# Patient Record
Sex: Female | Born: 1985 | Race: White | Hispanic: No | Marital: Married | Smoking: Never smoker
Health system: Southern US, Community
[De-identification: ages and names within clinical notes are randomized; demographics above are authoritative.]

## PROBLEM LIST (undated history)

## (undated) ENCOUNTER — Inpatient Hospital Stay (HOSPITAL_COMMUNITY): Payer: 59

## (undated) DIAGNOSIS — O4290 Premature rupture of membranes, unspecified as to length of time between rupture and onset of labor, unspecified weeks of gestation: Secondary | ICD-10-CM

## (undated) DIAGNOSIS — N83209 Unspecified ovarian cyst, unspecified side: Secondary | ICD-10-CM

## (undated) DIAGNOSIS — N200 Calculus of kidney: Secondary | ICD-10-CM

## (undated) DIAGNOSIS — I10 Essential (primary) hypertension: Secondary | ICD-10-CM

## (undated) DIAGNOSIS — O139 Gestational [pregnancy-induced] hypertension without significant proteinuria, unspecified trimester: Secondary | ICD-10-CM

## (undated) HISTORY — DX: Essential (primary) hypertension: I10

## (undated) HISTORY — DX: Calculus of kidney: N20.0

## (undated) HISTORY — PX: WISDOM TOOTH EXTRACTION: SHX21

---
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
## 2003-03-19 ENCOUNTER — Other Ambulatory Visit: Admission: RE | Admit: 2003-03-19 | Discharge: 2003-03-19 | Payer: Self-pay | Admitting: Gynecology

## 2003-10-06 ENCOUNTER — Other Ambulatory Visit: Admission: RE | Admit: 2003-10-06 | Discharge: 2003-10-06 | Payer: Self-pay | Admitting: Gynecology

## 2004-01-27 ENCOUNTER — Other Ambulatory Visit: Admission: RE | Admit: 2004-01-27 | Discharge: 2004-01-27 | Payer: Self-pay | Admitting: Gynecology

## 2004-08-31 ENCOUNTER — Other Ambulatory Visit: Admission: RE | Admit: 2004-08-31 | Discharge: 2004-08-31 | Payer: Self-pay | Admitting: Gynecology

## 2005-04-06 ENCOUNTER — Other Ambulatory Visit: Admission: RE | Admit: 2005-04-06 | Discharge: 2005-04-06 | Payer: Self-pay | Admitting: Gynecology

## 2006-03-30 ENCOUNTER — Other Ambulatory Visit: Admission: RE | Admit: 2006-03-30 | Discharge: 2006-03-30 | Payer: Self-pay | Admitting: Gynecology

## 2006-12-06 ENCOUNTER — Encounter: Payer: Self-pay | Admitting: Internal Medicine

## 2006-12-29 ENCOUNTER — Ambulatory Visit: Payer: Self-pay | Admitting: Internal Medicine

## 2006-12-29 DIAGNOSIS — I10 Essential (primary) hypertension: Secondary | ICD-10-CM | POA: Insufficient documentation

## 2007-01-04 LAB — CONVERTED CEMR LAB
CO2: 27 meq/L (ref 19–32)
Calcium: 9.6 mg/dL (ref 8.4–10.5)
Chloride: 103 meq/L (ref 96–112)
Glucose, Bld: 81 mg/dL (ref 70–99)
Sodium: 139 meq/L (ref 135–145)

## 2007-01-05 ENCOUNTER — Encounter: Payer: Self-pay | Admitting: Internal Medicine

## 2007-01-05 ENCOUNTER — Ambulatory Visit: Payer: Self-pay

## 2007-01-31 ENCOUNTER — Ambulatory Visit: Payer: Self-pay | Admitting: Internal Medicine

## 2007-02-27 ENCOUNTER — Telehealth: Payer: Self-pay | Admitting: Internal Medicine

## 2007-04-03 ENCOUNTER — Ambulatory Visit: Payer: Self-pay | Admitting: Internal Medicine

## 2007-04-10 LAB — CONVERTED CEMR LAB
Basophils Absolute: 0 10*3/uL (ref 0.0–0.1)
Creatinine, Ser: 0.7 mg/dL (ref 0.4–1.2)
Eosinophils Absolute: 0.4 10*3/uL (ref 0.0–0.6)
GFR calc Af Amer: 133 mL/min
GFR calc non Af Amer: 110 mL/min
HCT: 39.9 % (ref 36.0–46.0)
Hemoglobin: 13.6 g/dL (ref 12.0–15.0)
MCHC: 34 g/dL (ref 30.0–36.0)
MCV: 90.9 fL (ref 78.0–100.0)
Monocytes Absolute: 0.3 10*3/uL (ref 0.2–0.7)
Neutro Abs: 3 10*3/uL (ref 1.4–7.7)
Neutrophils Relative %: 55.5 % (ref 43.0–77.0)
Potassium: 4.2 meq/L (ref 3.5–5.1)
Sodium: 139 meq/L (ref 135–145)

## 2007-04-18 ENCOUNTER — Encounter: Payer: Self-pay | Admitting: Internal Medicine

## 2007-12-17 ENCOUNTER — Encounter: Payer: Self-pay | Admitting: Internal Medicine

## 2007-12-27 ENCOUNTER — Ambulatory Visit: Payer: Self-pay | Admitting: Internal Medicine

## 2007-12-28 ENCOUNTER — Ambulatory Visit: Payer: Self-pay | Admitting: Internal Medicine

## 2007-12-29 ENCOUNTER — Encounter: Payer: Self-pay | Admitting: Internal Medicine

## 2007-12-29 LAB — CONVERTED CEMR LAB: Rubella: 27.9 intl units/mL — ABNORMAL HIGH

## 2008-01-01 ENCOUNTER — Telehealth (INDEPENDENT_AMBULATORY_CARE_PROVIDER_SITE_OTHER): Payer: Self-pay | Admitting: *Deleted

## 2008-01-01 LAB — CONVERTED CEMR LAB
BUN: 12 mg/dL (ref 6–23)
Creatinine, Ser: 0.7 mg/dL (ref 0.4–1.2)
Eosinophils Absolute: 0.2 10*3/uL (ref 0.0–0.7)
Eosinophils Relative: 4.2 % (ref 0.0–5.0)
GFR calc Af Amer: 132 mL/min
GFR calc non Af Amer: 109 mL/min
HCT: 39.9 % (ref 36.0–46.0)
MCV: 91.7 fL (ref 78.0–100.0)
Monocytes Absolute: 0.3 10*3/uL (ref 0.1–1.0)
Platelets: 277 10*3/uL (ref 150–400)
Potassium: 3.7 meq/L (ref 3.5–5.1)
WBC: 4.7 10*3/uL (ref 4.5–10.5)

## 2008-01-04 ENCOUNTER — Encounter (INDEPENDENT_AMBULATORY_CARE_PROVIDER_SITE_OTHER): Payer: Self-pay | Admitting: *Deleted

## 2008-01-18 ENCOUNTER — Ambulatory Visit: Payer: Self-pay

## 2008-01-18 ENCOUNTER — Encounter: Payer: Self-pay | Admitting: Internal Medicine

## 2008-01-22 ENCOUNTER — Telehealth (INDEPENDENT_AMBULATORY_CARE_PROVIDER_SITE_OTHER): Payer: Self-pay | Admitting: *Deleted

## 2008-04-01 ENCOUNTER — Ambulatory Visit: Payer: Self-pay | Admitting: Internal Medicine

## 2008-04-09 ENCOUNTER — Encounter: Admission: RE | Admit: 2008-04-09 | Discharge: 2008-04-09 | Payer: Self-pay | Admitting: Internal Medicine

## 2008-04-10 ENCOUNTER — Telehealth (INDEPENDENT_AMBULATORY_CARE_PROVIDER_SITE_OTHER): Payer: Self-pay | Admitting: *Deleted

## 2008-06-03 ENCOUNTER — Encounter: Payer: Self-pay | Admitting: Internal Medicine

## 2008-06-25 ENCOUNTER — Ambulatory Visit: Payer: Self-pay | Admitting: Internal Medicine

## 2009-04-21 ENCOUNTER — Ambulatory Visit: Payer: Self-pay | Admitting: Internal Medicine

## 2009-04-23 LAB — CONVERTED CEMR LAB
BUN: 15 mg/dL (ref 6–23)
CO2: 30 meq/L (ref 19–32)
Chloride: 107 meq/L (ref 96–112)
Glucose, Bld: 93 mg/dL (ref 70–99)
LDL Cholesterol: 73 mg/dL (ref 0–99)
Potassium: 4.1 meq/L (ref 3.5–5.1)
Total CHOL/HDL Ratio: 2
VLDL: 5.4 mg/dL (ref 0.0–40.0)

## 2009-05-11 ENCOUNTER — Encounter: Payer: Self-pay | Admitting: Internal Medicine

## 2009-09-23 ENCOUNTER — Telehealth: Payer: Self-pay | Admitting: Internal Medicine

## 2010-03-14 LAB — CONVERTED CEMR LAB: Cholesterol: 252 mg/dL

## 2010-03-16 NOTE — Letter (Signed)
Summary: Beather Arbour MD  Beather Arbour MD   Imported By: Lanelle Bal 05/16/2009 10:12:02  _____________________________________________________________________  External Attachment:    Type:   Image     Comment:   External Document

## 2010-03-16 NOTE — Assessment & Plan Note (Signed)
Summary: FOR MED REFILL//PH   Vital Signs:  Patient profile:   25 year old female Height:      63 inches Weight:      122.2 pounds BMI:     21.73 Pulse rate:   72 / minute BP sitting:   120 / 80  Vitals Entered By: Shary Decamp (April 21, 2009 11:41 AM)  History of Present Illness: HTN f/u   Current Medications (verified): 1)  Lisinopril 20 Mg  Tabs (Lisinopril) .Marland Kitchen.. 1 By Mouth Qd 2)  Mirena 20 Mcg/24hr Iud (Levonorgestrel)  Allergies (verified): No Known Drug Allergies  Past History:  Past Medical History: Reviewed history from 06/25/2008 and no changes required.  G0 Hypertension  Past Surgical History: Reviewed history from 06/25/2008 and no changes required. 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 CV:  Denies swelling of feet. Resp:  Denies cough and shortness of breath. Psych:  ++ stress due to training for anesthesia .  Physical Exam  General:  alert, well-developed, and well-nourished.   Lungs:  normal respiratory effort, no intercostal retractions, no accessory muscle use, and normal breath sounds.   Heart:  normal rate, regular rhythm, and no murmur.   Extremities:  no pretibial edema bilaterally    Impression & Recommendations:  Problem # 1:  HYPERTENSION (ICD-401.9) well-controlled particularly since she discontinued birth control pills, see review of systems review medications labs return to the office in one year she is advised to call me if her blood pressure is going down to 110 or 100 and she is symptomatic; she will have to decrease the lisinopril  dose if that is the case Her updated medication list for this problem includes:    Lisinopril 20 Mg Tabs (Lisinopril) .Marland Kitchen... 1 by mouth qd  Orders: Venipuncture (11914) TLB-BMP (Basic Metabolic  Panel-BMET) (80048-METABOL) TLB-Lipid Panel (80061-LIPID)  Complete Medication List: 1)  Lisinopril 20 Mg Tabs (Lisinopril) .Marland Kitchen.. 1 by mouth qd 2)  Iud (cooper)   Patient Instructions: 1)  Please schedule a follow-up appointment in 1 year.  Prescriptions: LISINOPRIL 20 MG  TABS (LISINOPRIL) 1 by mouth qd  #30 x 6   Entered by:   Shary Decamp   Authorized by:   Nolon Rod. Meko Bellanger MD   Signed by:   Shary Decamp on 04/21/2009   Method used:   Printed then faxed to ...       [facility] (retail)       [address]       [place], [place]  [address]       Ph: [phone]       Fax: [phone]   RxID:   [id number]

## 2010-03-16 NOTE — Progress Notes (Signed)
Summary: Lice?   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 She thinks she may have lice. She asked if we could give her a call back. Called pt back and left a message for pt to call back. Army Fossa CMA  September 23, 2009 3:54 PM   Follow-up for Phone Call        Pt states that she has done the home treatments twice. One of the physicans she works with called in Malth[place](?) for her. That is not working. She would like to know if Dr.Paz has any other suggestions. She is in [place] doing her clinical rotations. Army Fossa CMA  September 23, 2009 3:58 PM   Additional Follow-up for Phone Call Additional follow up Details #1::         Elimite liqid to her hair and scalp , washed it off in 10 minutes. Repeat in one week. needs to wash and iron  all her clothes and bed sheets Additional Follow-up by: Jose E. Paz MD,  September 24, 2009 1:16 PM    Additional Follow-up for Phone Call Additional follow up Details #2::    I spoke with pt she is aware of these instructions she will try this. Army Fossa CMA  September 24, 2009 1:21 PM      Appended Document: Lice?     Clinical Lists Changes  Medications: Added new medication of RID COMPLETE LICE ELIMINATION  KIT (PYRETH-PIP BUTOX-PERMETH-NITRE) her hair and scalp , washed it off in 10 minutes. Repeat in one week. - Signed Rx of RID COMPLETE LICE ELIMINATION  KIT (PYRETH-PIP BUTOX-PERMETH-NITRE) her hair and scalp , washed it off in 10 minutes. Repeat in one week.;  #1 bottle x 0;  Signed;  Entered by: Army Fossa CMA;  Authorized by: Nolon Rod Paz MD;  Method used: Electronically to [facility] [address], [place], [place], [place]  [address], Ph: [phone], Fax: [phone]    Prescriptions: RID COMPLETE LICE ELIMINATION  KIT (PYRETH-PIP BUTOX-PERMETH-NITRE) her hair and scalp ,  washed it off in 10 minutes. Repeat in one week.  #1 bottle x 0   Entered by:   Army Fossa CMA   Authorized by:   Nolon Rod. Paz MD   Signed by:   Army Fossa CMA on 09/25/2009   Method used:   Electronically to        [facility] (retail)       [address]       [place], [place]  [address]       Ph: [phone]       Fax: [phone]   RxID:   [id number]   Pt requested it be called into [facility] [address]

## 2010-05-24 ENCOUNTER — Other Ambulatory Visit: Payer: Self-pay | Admitting: Internal Medicine

## 2010-06-24 ENCOUNTER — Other Ambulatory Visit: Payer: Self-pay | Admitting: Internal Medicine

## 2010-07-18 ENCOUNTER — Other Ambulatory Visit: Payer: Self-pay | Admitting: Internal Medicine

## 2010-07-19 ENCOUNTER — Telehealth: Payer: Self-pay | Admitting: *Deleted

## 2010-07-19 NOTE — Telephone Encounter (Signed)
Pt is due for an appt, please tell pt no additional refills will be given without an appt.

## 2010-07-28 NOTE — Telephone Encounter (Signed)
LMOM to call to schedule appt with Dr Remus Blake that it was very important for her to call and schedule this appt as soon as she could

## 2010-08-11 NOTE — Telephone Encounter (Signed)
Has appt with Dr Drue Novel on 7/12 at 9:15

## 2010-08-26 ENCOUNTER — Encounter: Payer: Self-pay | Admitting: Internal Medicine

## 2010-08-26 ENCOUNTER — Ambulatory Visit (INDEPENDENT_AMBULATORY_CARE_PROVIDER_SITE_OTHER): Payer: PRIVATE HEALTH INSURANCE | Admitting: Internal Medicine

## 2010-08-26 VITALS — BP 124/86 | HR 74 | Wt 129.0 lb

## 2010-08-26 DIAGNOSIS — I10 Essential (primary) hypertension: Secondary | ICD-10-CM

## 2010-08-26 LAB — CBC WITH DIFFERENTIAL/PLATELET
Basophils Relative: 0.3 % (ref 0.0–3.0)
Eosinophils Absolute: 0.4 10*3/uL (ref 0.0–0.7)
HCT: 42 % (ref 36.0–46.0)
Hemoglobin: 14.8 g/dL (ref 12.0–15.0)
Lymphocytes Relative: 28.8 % (ref 12.0–46.0)
Lymphs Abs: 2 10*3/uL (ref 0.7–4.0)
MCHC: 35.1 g/dL (ref 30.0–36.0)
MCV: 93.5 fl (ref 78.0–100.0)
Monocytes Absolute: 0.5 10*3/uL (ref 0.1–1.0)
Neutro Abs: 4 10*3/uL (ref 1.4–7.7)
RBC: 4.49 Mil/uL (ref 3.87–5.11)

## 2010-08-26 LAB — LIPID PANEL: Cholesterol: 180 mg/dL (ref 0–200)

## 2010-08-26 LAB — AST: AST: 20 U/L (ref 0–37)

## 2010-08-26 MED ORDER — LABETALOL HCL 100 MG PO TABS: 100.0000 mg | ORAL_TABLET | Freq: Two times a day (BID) | ORAL | Status: DC

## 2010-08-26 NOTE — Assessment & Plan Note (Addendum)
BP well-controlled with lisinopril 20 mg however patient is planning to get pregnant. Switch to labetalol. See instructions. Also likes general labs. See orders

## 2010-08-26 NOTE — Progress Notes (Signed)
  Subjective:    Patient ID: Tanya Hicks, female    DOB: [birth year], 25 y.o.   MRN: [id number]  HPI Doing well Likes to switch medications since she is planning to start a family  in 6-12 months.  Past Medical History  Diagnosis Date  . Hypertension    No past surgical history on file.   Review of Systems No chest pain, shortness of breath. No lower extremity edema.    Objective:   Physical Exam  Constitutional: She appears well-developed.  HENT:  Head: Normocephalic and atraumatic.  Cardiovascular: Normal rate, regular rhythm and normal heart sounds.   No murmur heard. Pulmonary/Chest: Effort normal and breath sounds normal. No respiratory distress. She has no wheezes. She has no rales.  Musculoskeletal: She exhibits no edema.          Assessment & Plan:

## 2010-08-26 NOTE — Patient Instructions (Signed)
Start labetalol BP goal 110/60 to 140/85, pulse no less than 55 Call with BP readings in 2 week Ok to self titrate to 2 tabs bid if needed

## 2010-08-30 ENCOUNTER — Telehealth: Payer: Self-pay | Admitting: *Deleted

## 2010-08-30 NOTE — Telephone Encounter (Signed)
Message left for patient to return my call.  

## 2010-08-30 NOTE — Telephone Encounter (Signed)
Message copied by Leanne Lovely on Mon Aug 30, 2010  2:17 PM ------      Message from: Tanya Hicks      Created: Mon Aug 30, 2010  1:19 PM       Advise patient, her cholesterol is very good, liver tests and CBC normal as well. Good results!

## 2010-08-30 NOTE — Telephone Encounter (Signed)
Spoke w/ pt she is aware. Copy mailed to pt.

## 2010-09-10 ENCOUNTER — Telehealth: Payer: Self-pay | Admitting: Internal Medicine

## 2010-09-10 NOTE — Telephone Encounter (Signed)
Pt says she was told to call w/ bp ranges have been between 105/72-122/81 and pulse have been at 71-82.

## 2010-09-10 NOTE — Telephone Encounter (Signed)
I spoke w/ pt she states she is feeling fine.

## 2010-09-10 NOTE — Telephone Encounter (Signed)
Blood pressure is slightly low at times (105), as long as she feels well I wouldn't change anything

## 2011-02-24 LAB — OB RESULTS CONSOLE GBS: GBS: NEGATIVE

## 2011-05-17 ENCOUNTER — Other Ambulatory Visit: Payer: Self-pay | Admitting: Internal Medicine

## 2011-05-18 NOTE — Telephone Encounter (Signed)
Refill done.  

## 2011-06-22 ENCOUNTER — Other Ambulatory Visit: Payer: Self-pay | Admitting: Internal Medicine

## 2011-06-23 NOTE — Telephone Encounter (Signed)
Refill done.  

## 2011-07-30 ENCOUNTER — Other Ambulatory Visit: Payer: Self-pay | Admitting: Internal Medicine

## 2011-08-01 NOTE — Telephone Encounter (Signed)
Refill done. Pt must have an office visit for further refills.  

## 2011-09-05 LAB — OB RESULTS CONSOLE ABO/RH

## 2011-09-05 LAB — OB RESULTS CONSOLE GC/CHLAMYDIA: Gonorrhea: NEGATIVE

## 2011-09-05 LAB — OB RESULTS CONSOLE RPR: RPR: NONREACTIVE

## 2011-09-05 LAB — OB RESULTS CONSOLE ANTIBODY SCREEN: Antibody Screen: NEGATIVE

## 2012-03-15 ENCOUNTER — Other Ambulatory Visit: Payer: Self-pay | Admitting: Obstetrics and Gynecology

## 2012-03-16 ENCOUNTER — Encounter (HOSPITAL_COMMUNITY): Payer: Self-pay | Admitting: *Deleted

## 2012-03-16 ENCOUNTER — Inpatient Hospital Stay (HOSPITAL_COMMUNITY)
Admission: AD | Admit: 2012-03-16 | Discharge: 2012-03-19 | DRG: 774 | Disposition: A | Discharge status: Home / Self Care | Payer: 59 | Source: Ambulatory Visit | Attending: Obstetrics and Gynecology | Admitting: Obstetrics and Gynecology

## 2012-03-16 ENCOUNTER — Encounter (HOSPITAL_COMMUNITY): Payer: Self-pay | Admitting: Anesthesiology

## 2012-03-16 ENCOUNTER — Inpatient Hospital Stay (HOSPITAL_COMMUNITY): Payer: 59 | Admitting: Anesthesiology

## 2012-03-16 DIAGNOSIS — I10 Essential (primary) hypertension: Secondary | ICD-10-CM

## 2012-03-16 DIAGNOSIS — O1002 Pre-existing essential hypertension complicating childbirth: Secondary | ICD-10-CM | POA: Diagnosis present

## 2012-03-16 HISTORY — DX: Unspecified ovarian cyst, unspecified side: N83.209

## 2012-03-16 LAB — COMPREHENSIVE METABOLIC PANEL
Alkaline Phosphatase: 136 U/L — ABNORMAL HIGH (ref 39–117)
BUN: 7 mg/dL (ref 6–23)
CO2: 20 mEq/L (ref 19–32)
GFR calc Af Amer: >90 mL/min (ref >90)
GFR calc non Af Amer: >90 mL/min (ref >90)
Glucose, Bld: 117 mg/dL — ABNORMAL HIGH (ref 70–99)
Potassium: 3.3 mEq/L — ABNORMAL LOW (ref 3.5–5.1)
Total Bilirubin: 0.3 mg/dL (ref 0.3–1.2)
Total Protein: 6.1 g/dL (ref 6.0–8.3)

## 2012-03-16 LAB — CBC
HCT: 34.1 % — ABNORMAL LOW (ref 36.0–46.0)
Hemoglobin: 11.8 g/dL — ABNORMAL LOW (ref 12.0–15.0)
MCH: 31.7 pg (ref 26.0–34.0)
MCHC: 34.6 g/dL (ref 30.0–36.0)
MCHC: 34.3 g/dL (ref 30.0–36.0)
RBC: 3.69 MIL/uL — ABNORMAL LOW (ref 3.87–5.11)
RDW: 13.5 % (ref 11.5–15.5)

## 2012-03-16 LAB — URINALYSIS, DIPSTICK ONLY
Bilirubin Urine: NEGATIVE
Ketones, ur: NEGATIVE mg/dL
Nitrite: NEGATIVE
Protein, ur: NEGATIVE mg/dL
Urobilinogen, UA: 0.2 mg/dL (ref 0.0–1.0)

## 2012-03-16 LAB — RPR: RPR Ser Ql: NONREACTIVE

## 2012-03-16 MED ORDER — OXYTOCIN 40 UNITS IN LACTATED RINGERS INFUSION - SIMPLE MED
1.0000 m[IU]/min | INTRAVENOUS | Status: DC
  Administered 2012-03-16: 23 m[IU]/min via INTRAVENOUS
  Administered 2012-03-16: 1 m[IU]/min via INTRAVENOUS
  Filled 2012-03-16: qty 1000

## 2012-03-16 MED ORDER — LIDOCAINE HCL (PF) 1 % IJ SOLN
30.0000 mL | INTRAMUSCULAR | Status: DC | PRN
  Administered 2012-03-17: 30 mL via SUBCUTANEOUS
  Filled 2012-03-16: qty 30

## 2012-03-16 MED ORDER — LABETALOL HCL 100 MG PO TABS
100.0000 mg | ORAL_TABLET | Freq: Two times a day (BID) | ORAL | Status: DC
  Administered 2012-03-16 – 2012-03-17 (×2): 100 mg via ORAL
  Filled 2012-03-16 (×4): qty 1

## 2012-03-16 MED ORDER — OXYTOCIN 40 UNITS IN LACTATED RINGERS INFUSION - SIMPLE MED
62.5000 mL/h | INTRAVENOUS | Status: DC
  Administered 2012-03-17: 62.5 mL/h via INTRAVENOUS
  Filled 2012-03-16: qty 1000

## 2012-03-16 MED ORDER — DIPHENHYDRAMINE HCL 50 MG/ML IJ SOLN: 12.5000 mg | INTRAMUSCULAR | Status: DC | PRN

## 2012-03-16 MED ORDER — LIDOCAINE HCL (PF) 1 % IJ SOLN
INTRAMUSCULAR | Status: DC | PRN
  Administered 2012-03-16 (×4): 4 mL

## 2012-03-16 MED ORDER — LACTATED RINGERS IV SOLN: 500.0000 mL | INTRAVENOUS | Status: DC | PRN

## 2012-03-16 MED ORDER — LACTATED RINGERS IV SOLN
500.0000 mL | Freq: Once | INTRAVENOUS | Status: AC
  Administered 2012-03-16: 250 mL via INTRAVENOUS

## 2012-03-16 MED ORDER — OXYCODONE-ACETAMINOPHEN 5-325 MG PO TABS: 1.0000 | ORAL_TABLET | ORAL | Status: DC | PRN

## 2012-03-16 MED ORDER — LACTATED RINGERS IV SOLN
INTRAVENOUS | Status: DC
  Administered 2012-03-16 – 2012-03-17 (×3): via INTRAVENOUS

## 2012-03-16 MED ORDER — PHENYLEPHRINE 40 MCG/ML (10ML) SYRINGE FOR IV PUSH (FOR BLOOD PRESSURE SUPPORT): 80.0000 ug | PREFILLED_SYRINGE | INTRAVENOUS | Status: DC | PRN

## 2012-03-16 MED ORDER — ONDANSETRON HCL 4 MG/2ML IJ SOLN
4.0000 mg | Freq: Four times a day (QID) | INTRAMUSCULAR | Status: DC | PRN
  Administered 2012-03-16 – 2012-03-17 (×2): 4 mg via INTRAVENOUS
  Filled 2012-03-16 (×2): qty 2

## 2012-03-16 MED ORDER — IBUPROFEN 600 MG PO TABS: 600.0000 mg | ORAL_TABLET | Freq: Four times a day (QID) | ORAL | Status: DC | PRN

## 2012-03-16 MED ORDER — FENTANYL 2.5 MCG/ML BUPIVACAINE 1/10 % EPIDURAL INFUSION (WH - ANES)
14.0000 mL/h | INTRAMUSCULAR | Status: DC
  Administered 2012-03-16: 14 mL/h via EPIDURAL
  Filled 2012-03-16: qty 125

## 2012-03-16 MED ORDER — PHENYLEPHRINE 40 MCG/ML (10ML) SYRINGE FOR IV PUSH (FOR BLOOD PRESSURE SUPPORT)
80.0000 ug | PREFILLED_SYRINGE | INTRAVENOUS | Status: DC | PRN
  Filled 2012-03-16: qty 5

## 2012-03-16 MED ORDER — EPHEDRINE 5 MG/ML INJ
10.0000 mg | INTRAVENOUS | Status: DC | PRN
  Filled 2012-03-16: qty 4

## 2012-03-16 MED ORDER — EPHEDRINE 5 MG/ML INJ: 10.0000 mg | INTRAVENOUS | Status: DC | PRN

## 2012-03-16 MED ORDER — OXYTOCIN BOLUS FROM INFUSION: 500.0000 mL | INTRAVENOUS | Status: DC

## 2012-03-16 NOTE — Progress Notes (Signed)
Patient ID: Tanya Hicks, female   DOB: Jul 14, 1985, 27 y.o.   MRN: [id number] I examined the pt at 4:56 and 6:05 and there was slight change from 1-2 cm and 50 % effaced to 1-2 cm and 60-70 % effaced. The vertex is at - 2 station. There are no FHR abnormalities. The pitocin is at 23 mu/ minute.

## 2012-03-16 NOTE — Progress Notes (Signed)
Patient ID: Tanya Hicks, female   DOB: 04/14/1985, 27 y.o.   MRN: [id number] Pitocin at 31 mu/ minute and the contractions are more than 5 per 10 minutes so I will back the pitocin down slightly. The cervix is unchanged so I inserted a Foley into the uterus and inflated it to 60 cc's.

## 2012-03-16 NOTE — H&P (Signed)
NAME:  Tanya Hicks, Tanya Hicks                      ACCOUNT NO.:  MEDICAL RECORD NO.:  [id number]  LOCATION:                                 FACILITY:  PHYSICIAN:  Malachi Pro. Ambrose Mantle, M.D. DATE OF BIRTH:  10/16/1985  DATE OF ADMISSION:  03/16/2012 DATE OF DISCHARGE:                             HISTORY & PHYSICAL   PRESENT ILLNESS:  This is a 27 year old white female, para 0, gravida 1, whose last menstrual period was Jun 18, 2011, [facility] is March 30, 2012 by an ultrasound at 6 weeks and 4 days done on August 09, 2011.  Blood group and type; O positive, negative antibody.  Pap smear normal.  Rubella immune.  RPR nonreactive.  Urine culture positive for strep viridans. Hepatitis B surface antigen negative.  HIV negative.  GC and Chlamydia negative.  First trimester screen and AFP negative.  One hour Glucola 120.  Group B strep negative.  This patient has hypertension pre-seated her pregnancy and she began pregnancy she was on labetalol however this was discontinued because her blood pressure was so good subsequently they rose and she was started back on labetalol is now taking labetalol 100 mg by mouth twice a day.  Over the last few days.  Her blood pressures risen somewhat consistently above 140/90 as high as low 150 or below 100.  She is admitted at [redacted] weeks gestation for induction of labor because of her hypertension, blood pressure in our office on the day prior to admission 168/98.  She has had normal labs for preeclampsia and normal 24-hour urine for preeclampsia.  PAST MEDICAL HISTORY:  Reveals she has had a history of hypertension started medications at age 34.  She had abnormal Pap smear at one time, but repeats were normal.  PAST SURGICAL HISTORY:  Wisdom teeth extraction at age 20.  Medications labetalol 100 mg twice a day.  ALLERGIES:  No known drug allergies.  No latex allergy.  FAMILY HISTORY:  Father with hypertension and aortic stenosis.  Paternal grandmother with breast  cancer, diabetes, and ovarian cancer.  SOCIAL HISTORY:  The patient denies alcohol, illicit substance abuse, and tobacco.  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  She is married since 2008.  PHYSICAL EXAMINATION:  VITAL SIGNS:  Blood pressure is 160/98, pulse is 80. HEAD, EYES, NOSE, AND THROAT:  Normal. LUNGS:  Clear to auscultation. HEART:  Normal size and sounds.  No murmurs. ABDOMEN:  Soft.  Fundal height 35.5 cm.  Fetal heart tones normal. Cervix is 1 cm, 30%, vertex at -4.  ADMITTING IMPRESSION:  Intrauterine pregnancy at 38 weeks, chronic hypertension, arising slowly in late pregnancy.  The patient is admitted for induction of labor.     Malachi Pro. Ambrose Mantle, M.D.     TFH/MEDQ  D:  03/15/2012  T:  03/15/2012  Job:  [id number]

## 2012-03-16 NOTE — Progress Notes (Signed)
Patient ID: Tanya Hicks, female   DOB: [birth year], 27 y.o.   MRN: [id number] Contractions are now q 2 minutes and the cervix feels more dilated and effaced around the Foley

## 2012-03-16 NOTE — Anesthesia Procedure Notes (Signed)
Epidural Patient location during procedure: OB Start time: 03/16/2012 8:04 PM  Staffing Performed by: anesthesiologist   Preanesthetic Checklist Completed: patient identified, site marked, surgical consent, pre-op evaluation, timeout performed, IV checked, risks and benefits discussed and monitors and equipment checked  Epidural Patient position: sitting Prep: site prepped and draped and DuraPrep Patient monitoring: continuous pulse ox and blood pressure Approach: midline Injection technique: LOR air  Needle:  Needle type: Tuohy  Needle gauge: 17 G Needle length: 9 cm and 9 Needle insertion depth: 4 cm Catheter type: closed end flexible Catheter size: 19 Gauge Catheter at skin depth: 9 cm Test dose: negative  Assessment Events: blood not aspirated, injection not painful, no injection resistance, negative IV test and no paresthesia  Additional Notes Discussed risk of headache, infection, bleeding, nerve injury and failed or incomplete block.  Patient voices understanding and wishes to proceed.  Epidural placed easily on first attempt.  No paresthesia.  Patient tolerated procedure well with no apparent complications.  Jasmine December, MD Reason for block:procedure for pain

## 2012-03-16 NOTE — Progress Notes (Signed)
Patient ID: Tanya Hicks, female   DOB: 1985-09-06, 27 y.o.   MRN: [id number] Labs are normal. The pitocin is at 15 mu/ minute and the contractions are q 2-3 minutes. The cervix is 1-2 cm 40% effaced and the vertex is at -2/-3 station. AROM produced clear fluid.

## 2012-03-16 NOTE — Anesthesia Preprocedure Evaluation (Addendum)
Anesthesia Evaluation  Patient identified by MRN, date of birth, ID band Patient awake    Reviewed: Allergy & Precautions, H&P , NPO status , Patient's Chart, lab work & pertinent test results, reviewed documented beta blocker date and time   History of Anesthesia Complications Negative for: history of anesthetic complications  Airway Mallampati: II TM Distance: >3 FB Neck ROM: full    Dental  (+) Teeth Intact   Pulmonary neg pulmonary ROS,  breath sounds clear to auscultation        Cardiovascular hypertension, On Home Beta Blockers Rhythm:regular Rate:Normal     Neuro/Psych negative neurological ROS  negative psych ROS   GI/Hepatic Neg liver ROS, GERD-  Medicated,  Endo/Other  negative endocrine ROS  Renal/GU negative Renal ROS     Musculoskeletal   Abdominal   Peds  Hematology negative hematology ROS (+)   Anesthesia Other Findings   Reproductive/Obstetrics (+) Pregnancy                           Anesthesia Physical Anesthesia Plan  ASA: II  Anesthesia Plan: Epidural   Post-op Pain Management:    Induction:   Airway Management Planned:   Additional Equipment:   Intra-op Plan:   Post-operative Plan:   Informed Consent: I have reviewed the patients History and Physical, chart, labs and discussed the procedure including the risks, benefits and alternatives for the proposed anesthesia with the patient or authorized representative who has indicated his/her understanding and acceptance.     Plan Discussed with:   Anesthesia Plan Comments:         Anesthesia Quick Evaluation

## 2012-03-16 NOTE — Progress Notes (Addendum)
Patient ID: Tanya Hicks, female   DOB: 1985-11-09, 27 y.o.   MRN: [id number] Pt has received an epidural and is comfortable. The pitocin is at 31 mu/minute and the cervix is 2 cm 70% effaced and the vertex is at -1/-2 station.

## 2012-03-16 NOTE — Progress Notes (Signed)
Patient ID: Tanya Hicks, female   DOB: [birth year], 27 y.o.   MRN: [id number] Pitocin at 27 mu/ minute and the contractions are q 2 to 2 and 1/2 minutes apart. The cervix is unchanged since the last exam and the vertex is at -1/-2 station.The pt requests an epidural

## 2012-03-17 ENCOUNTER — Encounter (HOSPITAL_COMMUNITY): Payer: Self-pay | Admitting: *Deleted

## 2012-03-17 LAB — CBC
HCT: 34.1 % — ABNORMAL LOW (ref 36.0–46.0)
Hemoglobin: 11.6 g/dL — ABNORMAL LOW (ref 12.0–15.0)
MCV: 93.2 fL (ref 78.0–100.0)
RBC: 3.66 MIL/uL — ABNORMAL LOW (ref 3.87–5.11)
WBC: 17.1 10*3/uL — ABNORMAL HIGH (ref 4.0–10.5)

## 2012-03-17 MED ORDER — TETANUS-DIPHTH-ACELL PERTUSSIS 5-2.5-18.5 LF-MCG/0.5 IM SUSP: 0.5000 mL | Freq: Once | INTRAMUSCULAR | Status: DC

## 2012-03-17 MED ORDER — FAMOTIDINE 20 MG PO TABS
20.0000 mg | ORAL_TABLET | Freq: Every day | ORAL | Status: DC
  Administered 2012-03-18 – 2012-03-19 (×2): 20 mg via ORAL
  Filled 2012-03-17 (×3): qty 1

## 2012-03-17 MED ORDER — OXYCODONE-ACETAMINOPHEN 5-325 MG PO TABS: 1.0000 | ORAL_TABLET | ORAL | Status: DC | PRN

## 2012-03-17 MED ORDER — SIMETHICONE 80 MG PO CHEW: 80.0000 mg | CHEWABLE_TABLET | ORAL | Status: DC | PRN

## 2012-03-17 MED ORDER — ONDANSETRON HCL 4 MG PO TABS: 4.0000 mg | ORAL_TABLET | ORAL | Status: DC | PRN

## 2012-03-17 MED ORDER — LANOLIN HYDROUS EX OINT: TOPICAL_OINTMENT | CUTANEOUS | Status: DC | PRN

## 2012-03-17 MED ORDER — LACTATED RINGERS IV SOLN: INTRAVENOUS | Status: DC

## 2012-03-17 MED ORDER — SENNOSIDES-DOCUSATE SODIUM 8.6-50 MG PO TABS
2.0000 | ORAL_TABLET | Freq: Every day | ORAL | Status: DC
  Administered 2012-03-17 – 2012-03-18 (×2): 2 via ORAL

## 2012-03-17 MED ORDER — BENZOCAINE-MENTHOL 20-0.5 % EX AERO
1.0000 "application " | INHALATION_SPRAY | CUTANEOUS | Status: DC | PRN
  Administered 2012-03-17: 1 via TOPICAL
  Filled 2012-03-17: qty 56

## 2012-03-17 MED ORDER — IBUPROFEN 600 MG PO TABS
600.0000 mg | ORAL_TABLET | Freq: Four times a day (QID) | ORAL | Status: DC
  Administered 2012-03-17 – 2012-03-19 (×10): 600 mg via ORAL
  Filled 2012-03-17 (×9): qty 1

## 2012-03-17 MED ORDER — ZOLPIDEM TARTRATE 5 MG PO TABS: 5.0000 mg | ORAL_TABLET | Freq: Every evening | ORAL | Status: DC | PRN

## 2012-03-17 MED ORDER — MEASLES, MUMPS & RUBELLA VAC ~~LOC~~ INJ
0.5000 mL | INJECTION | Freq: Once | SUBCUTANEOUS | Status: DC
  Filled 2012-03-17: qty 0.5

## 2012-03-17 MED ORDER — DIPHENHYDRAMINE HCL 25 MG PO CAPS: 25.0000 mg | ORAL_CAPSULE | Freq: Four times a day (QID) | ORAL | Status: DC | PRN

## 2012-03-17 MED ORDER — PRENATAL MULTIVITAMIN CH
1.0000 | ORAL_TABLET | Freq: Every day | ORAL | Status: DC
  Administered 2012-03-17 – 2012-03-19 (×3): 1 via ORAL
  Filled 2012-03-17 (×2): qty 1

## 2012-03-17 MED ORDER — OXYTOCIN 40 UNITS IN LACTATED RINGERS INFUSION - SIMPLE MED: 62.5000 mL/h | INTRAVENOUS | Status: AC | PRN

## 2012-03-17 MED ORDER — LABETALOL HCL 100 MG PO TABS
100.0000 mg | ORAL_TABLET | Freq: Two times a day (BID) | ORAL | Status: DC
  Administered 2012-03-17 – 2012-03-19 (×4): 100 mg via ORAL
  Filled 2012-03-17 (×7): qty 1

## 2012-03-17 MED ORDER — PRENATAL MULTIVITAMIN CH
1.0000 | ORAL_TABLET | Freq: Every day | ORAL | Status: DC
  Filled 2012-03-17: qty 1

## 2012-03-17 MED ORDER — ONDANSETRON HCL 4 MG/2ML IJ SOLN: 4.0000 mg | INTRAMUSCULAR | Status: DC | PRN

## 2012-03-17 MED ORDER — WITCH HAZEL-GLYCERIN EX PADS
1.0000 "application " | MEDICATED_PAD | CUTANEOUS | Status: DC | PRN
  Administered 2012-03-18: 1 via TOPICAL

## 2012-03-17 MED ORDER — DIBUCAINE 1 % RE OINT: 1.0000 "application " | TOPICAL_OINTMENT | RECTAL | Status: DC | PRN

## 2012-03-17 NOTE — Anesthesia Postprocedure Evaluation (Signed)
Anesthesia Post Note  Patient: Tanya Hicks  Procedure(s) Performed: * No procedures listed *  Anesthesia type: Epidural  Patient location: Mother/Baby  Post pain: Pain level controlled  Post assessment: Post-op Vital signs reviewed  Last Vitals:  Filed Vitals:   03/17/12 0752  BP: 134/86  Pulse: 88  Temp: 36.7 C  Resp: 20    Post vital signs: Reviewed  Level of consciousness:alert  Complications: No apparent anesthesia complications

## 2012-03-17 NOTE — Progress Notes (Signed)
Patient ID: Tanya Hicks, female   DOB: 04-25-1985, 27 y.o.   MRN: [id number] DOD no complaints.

## 2012-03-17 NOTE — Progress Notes (Signed)
Patient ID: Tanya Hicks, female   DOB: [birth year], 27 y.o.   MRN: [id number] Delivery note:  The pt was asked to push at 3:35 AM and made slow but steady progress. Decelerations were treated with O2 and lateral displacement of the uterus.With a significant amount of caput showing the decelerations became longer and a decision was made with the pt's consent to apply a vacuum to effect delivery. Dr. Venetia Constable was called and a Mityvac Bell cup vacuum was applied and with one contraction and no pop-offs a living female infant was delivered  ROA over an intact perineum with lacerations of the vagina at 4 and 8 o'clock and the left labia. The Apgars were assigned by Dr. Venetia Constable and were 8 and 9 at 1 and 5 minutes. The placenta was delivered intact and the uterus was normal. The lacerations bled briskly and were repaired with 3-0 vicryl under local block. EBL 500 cc's. There baby appeared to be small but the baby has not been weighed.

## 2012-03-17 NOTE — Progress Notes (Signed)
Patient ID: Tanya Hicks, female   DOB: 01-31-1986, 27 y.o.   MRN: [id number] I was called at 1:40 AM and the cervix was 4-5 cm. At 2:30 AM the cervix was 8-9 cm. I came to the hospital and at 3AM THE CERVIX WAS CLOSE TO 10 CM. I am going to watch her for 30 minutes to see if the head will descend some before pushing since the FHR became somewhat unstable during the rapid dilatation. The FHR never dipped below 90 but the FHR was 90-110 for several minutes. The vertex feels very soft so I have not been able to determine the position. At present the FHR seems to have completely recovered.

## 2012-03-18 LAB — CBC
HCT: 28.3 % — ABNORMAL LOW (ref 36.0–46.0)
Hemoglobin: 9.5 g/dL — ABNORMAL LOW (ref 12.0–15.0)
MCH: 31.7 pg (ref 26.0–34.0)
MCV: 94.3 fL (ref 78.0–100.0)
RBC: 3 MIL/uL — ABNORMAL LOW (ref 3.87–5.11)

## 2012-03-18 NOTE — Progress Notes (Signed)
Patient ID: Tanya Hicks, female   DOB: 30-Sep-1985, 27 y.o.   MRN: [id number] #1 afebrile BP normal  Baby doing well but BS borderline.

## 2012-03-19 NOTE — Discharge Summary (Signed)
Tanya Hicks, Tanya Hicks                 ACCOUNT NO.:  [id number]  MEDICAL RECORD NO.:  [id number]  LOCATION:  9142                          FACILITY:  [facility]  PHYSICIAN:  Malachi Pro. Ambrose Mantle, M.D. DATE OF BIRTH:  02-15-1986  DATE OF ADMISSION:  03/16/2012 DATE OF DISCHARGE:  03/19/2012                              DISCHARGE SUMMARY   A 27 year old white female, para 0, gravida 1, EDC March 30, 2012, by an ultrasound at 6 weeks and 4 days done on August 09, 2011.  Blood group and type O positive.  Negative antibody.  Pap smear normal.  Rubella immune.  RPR nonreactive.  Urine culture positive for strep viridans, hepatitis B surface antigen, negative HIV, negative GC and Chlamydia negative.  First trimester screen and AFP negative.  One hour Glucola 120.  Group B strep negative.  The patient had pre-existing hypertension and was treated during pregnancy with labetalol.  Over the last few days prior to admission, her blood pressures have risen somewhat consistently above 140/90, as high as low 150 over close to 100.  She was admitted at 65 weeks' gestation for induction of labor.  Blood pressure in our office on the day prior to admission was 168/98.  She had, had normal labs and 24-hour urine prior to admission.  On admission, her cervix was 1 cm, 30% vertex, at a -4 station.  The Pitocin was raised to 15 milliunits a minute.  Contractions every 2-3 minutes.  At 1:38 p.m., cervix was 1-2 cm, 40% vertex, at a -2 to -3 and artificial rupture of the membranes produced clear fluid.  At 6:12 p.m., the Pitocin was at 23 milliunits a minute.  The cervix seemed to be 1-2 cm, 60-70% effaced. At 7:17 p.m., Pitocin at 27 milliunits a minute.  Cervix unchanged.  The patient requested an epidural.  She received an epidural and was comfortable.  At 9 p.m., the Pitocin was at 31 milliunits a minute and the cervix was 2 cm, 70% vertex, at a -1 to -2 station.  At 10:31 p.m., the Pitocin was at 31 milliunits a  minute of more than 5 contractions per 10 minutes, so I backed the Pitocin down slightly.  I inserted a Foley catheter into the uterus and inflated it to 60 mL.  By 11:22 p.m., the contractions were every 2 minutes.  Cervix felt more dilated around the Foley.  I was called at 1:40 a.m., the cervix was 4-5 cm.  At 2:30 a.m., the cervix was 8-9 cm.  I came to the hospital at 3 a.m., the cervix was close to 10 cm.  The patient was asked to push at 3:35 a.m., and made slow, but steady progress.  Decelerations were treated with O2 and lateral displacement of the uterus with a significant amount of caput showing the decelerations have became longer and decision was made with the patient's consent to apply a vacuum to affect delivery.  Dr. Francine Graven from Neonatology was called and a Mityvac Bell cup vacuum was applied and with 1 contraction and no pop offs, a living female infant, 5 pounds 14 ounces delivered ROA over an intact perineum with lacerations of the vagina at  4 and 8 o'clock in the left labia.  Apgars were 8 and nine at 1 and five minutes.  Lacerations bled briskly and were repaired with 3-0 Vicryl under local block.  Postpartum, the patient did well.  Her blood pressure remained reasonably well controlled on labetalol 100 mg twice a day, and on the second postpartum day, the patient was ready for discharge.  LABORATORY DATA:  Showed initial hemoglobin of 11.9, hematocrit 34.7, white count 13,700, platelet count 169,000. Followup hemoglobin was 9.5, platelet count 177,000.  Urinalysis was negative for protein.  FINAL DIAGNOSIS:  Intrauterine pregnancy at 38 weeks delivered vertex.  OPERATION:  Vacuum-assisted vaginal delivery, repair of vaginal lacerations and labial laceration on the left.  FINAL CONDITION:  Improved.  INSTRUCTIONS:  Include our regular discharge instruction booklet.  Then after visit summary, the patient declines analgesics.  States she will take Motrin  over-the-counter, and she will continue her labetalol 100 mg twice a day, and take ferrous sulfate.  She is advised to return to the office in 6 weeks for followup examination.     Malachi Pro. Ambrose Mantle, M.D.     TFH/MEDQ  D:  03/19/2012  T:  03/19/2012  Job:  [id number]

## 2012-03-19 NOTE — Progress Notes (Signed)
Patient ID: Tanya Hicks, female   DOB: Nov 01, 1985, 27 y.o.   MRN: [id number] #2 No problems for d/c. The baby has a high bilirubin so she may need to stay.

## 2012-03-21 ENCOUNTER — Ambulatory Visit (HOSPITAL_COMMUNITY)
Admit: 2012-03-21 | Discharge: 2012-03-21 | Disposition: A | Discharge status: Home / Self Care | Payer: 59 | Attending: Obstetrics and Gynecology | Admitting: Obstetrics and Gynecology

## 2012-03-21 NOTE — Progress Notes (Signed)
Adult Lactation Consultation Outpatient Visit Note  Patient Name: Tanya Hicks                                             [facility], DOB [birth year] now 40 days old, BW 5 lb. 14 oz. Date of Birth: 1985/02/19 Gestational Age at Delivery: Unknown Type of Delivery: SVB  Breastfeeding History: Frequency of Breastfeeding: every 3 hours Length of Feeding: 15 minutes, 1 breast each feeding Voids: 6-8/day Stools: 5/ past day, brown in color  Supplementing / Method: Pumping:  Type of Pump: Medela Pump N Style   Frequency:  1 day  Volume:  15 ml  With post pumping  Comments: Mom is here for feeding assessment. She went home using an SNS to supplement due to jaundice and difficult latch. Started to supplement in the hospital with SNS originally due to low blood sugars for the baby in the 1st 24 hours. Mom reports having difficulty getting her baby to latch. Baby Konrad Felix is either fussy or sleepy. It is taking her 40 minutes to get the baby latched and suckling. Once she latches Ann Maki with nurse for 15 minutes. She is taking 1 breast each feeding. Mom reports she saw the Pediatrician Monday and was told to stop supplementing. Her milk is in.    Consultation Evaluation: Mom's milk is in. Some nodules palpable on the right breast, outer quadrants. These softened with nursing on this side.   Initial Feeding Assessment: Pre-feed Weight:  5 lb. 8.6 oz/2512 gm Post-feed Weight:  5 lb. 10.3 oz/2560 gm Amount Transferred:  48 ml. Comments: Ann Maki has difficulty obtaining/maintaining a deep latch at the breast. She has a high palate and will suckle on your finger when touching the roof of her mouth. She keeps her tongue at the roof of her mouth and she has a very small mouth. She initially has some disorganization with her suck but she can develop a good rhythm once stimulated. She is still jaundiced but Mom reports it is decreasing on the lower half of her body. After trying some different  positions we decided to start a #20 nipple shield. After few attempts Baby Katelyn latched a was able to sustain a good sucking pattern for 25 minutes on the right breast. Lots of breast milk in the nipple shield at the end of the feeding.   Additional Feeding Assessment: Pre-feed Weight:  5 lb. 10.2 oz/2560 gm Post-feed Weight:  5 lb. 10.6 oz/2568 gm Amount Transferred:  8 ml. Comments:  From the left breast with nursing for 15 minutes using the #20 nipple shield. Baby Katelyn was sleepy at the breast.   Additional Feeding Assessment: Pre-feed Weight: Post-feed Weight: Amount Transferred: Comments:  Total Breast milk Transferred this Visit: 56 ml. Total Supplement Given: None  Additional Interventions: Advised Mom if unable to latch Baby Katelyn after 5-10 minutes without the nipple shield, then use the nipple shield and breast feed. Listen for swallows and look for milk in the nipple shield when she finishes each side. Advised to breast feed on both breasts if she can each feeding. Advised to empty the 1st breast well, then breast feed on the 2nd breast for at least 5-10 minutes, then alternate the next feeding. Monitor voids/stools. Demonstrated suck training with my finger and advised to do this to help her keep her tongue down. Application and cleaning  of the nipple shield discussed. Advised to be sure breast are softening with breastfeeding, if not Mom may need to pump to comfort.   Follow-Up  Wednesday, 03/28/12 at 4:00 with Lactation Dept.     Alfred Levins 03/21/2012, 1:36 PM

## 2012-03-28 ENCOUNTER — Ambulatory Visit (HOSPITAL_COMMUNITY)
Admission: RE | Admit: 2012-03-28 | Discharge: 2012-03-28 | Disposition: A | Discharge status: Home / Self Care | Payer: 59 | Source: Ambulatory Visit | Attending: Obstetrics and Gynecology | Admitting: Obstetrics and Gynecology

## 2012-03-28 ENCOUNTER — Ambulatory Visit (HOSPITAL_COMMUNITY): Payer: 59

## 2012-03-28 NOTE — Lactation Note (Addendum)
Infant Lactation Consultation Outpatient Visit Note  Patient Name: Sheril Hammond    ZOXW'R name: Yarieliz Wasser Date of Birth: [birth year]          DOB:  [birth year] Birth Weight:                            5 lbs. 14 oz    Weight today: 5 lbs. 76.35 oz  @ 59 days old Gestational Age at Delivery: 38 weeks Type of Delivery: Vaginal  Breastfeeding History Frequency of Breastfeeding: 2-3 hrs Length of Feeding: 15-25 mins  Voids:  8/24 hrs Stools: 10/24 hrs   Pumping:  Type of Pump:  Medela PIS   Frequency:  1-2 times a day for comfort  Volume:  < 4 oz  Comments:  Mom wanting to try to feed baby without use of nipple shield.  Assisted Buffie in trying to latch Katelyn without, but baby's mouth physically is too small to stay latched onto areola, she repeatedly slipped onto nipple.  Reassured [place] that she will grow, and become more able to latch wider, and deeper, but presently the nipple shield facilitates a better milk transfer.  Tips given on how to facilitate a deeper latch, user a U hold with the cross cradle hold.  To try periodically without, but not to make her fussy when trying.  Consultation Evaluation:  Initial Feeding Assessment: Pre-feed Weight:  2662 gms Post-feed Weight:  2742 gms Amount Transferred: 80 ml Comments:  Right breast using a nipple shield, 15 minutes.  Mom to pump the other side only if she is uncomfortable.     Follow-Up  To call us prn    Judee Clara 03/28/2012, 1:43 PM

## 2012-03-31 ENCOUNTER — Other Ambulatory Visit: Payer: Self-pay

## 2012-08-14 ENCOUNTER — Other Ambulatory Visit: Payer: Self-pay | Admitting: Dermatology

## 2012-11-15 ENCOUNTER — Telehealth: Payer: Self-pay

## 2012-11-15 NOTE — Telephone Encounter (Signed)
LM for CB  Need date of last PAP HM UTD WE flu vaccine (wks for [facility])

## 2012-11-16 ENCOUNTER — Ambulatory Visit (INDEPENDENT_AMBULATORY_CARE_PROVIDER_SITE_OTHER): Payer: 59 | Admitting: Internal Medicine

## 2012-11-16 ENCOUNTER — Encounter: Payer: Self-pay | Admitting: Internal Medicine

## 2012-11-16 VITALS — BP 112/77 | HR 82 | Temp 98.8°F | Ht 63.5 in | Wt 117.8 lb

## 2012-11-16 DIAGNOSIS — Z0000 Encounter for general adult medical examination without abnormal findings: Secondary | ICD-10-CM | POA: Insufficient documentation

## 2012-11-16 DIAGNOSIS — I10 Essential (primary) hypertension: Secondary | ICD-10-CM

## 2012-11-16 LAB — CBC WITH DIFFERENTIAL/PLATELET
Basophils Relative: 2.2 % (ref 0.0–3.0)
Eosinophils Absolute: 0.3 10*3/uL (ref 0.0–0.7)
Eosinophils Relative: 3.4 % (ref 0.0–5.0)
Lymphocytes Relative: 21.8 % (ref 12.0–46.0)
Monocytes Relative: 5.5 % (ref 3.0–12.0)
Neutrophils Relative %: 67.1 % (ref 43.0–77.0)
RBC: 4.52 Mil/uL (ref 3.87–5.11)
WBC: 7.5 10*3/uL (ref 4.5–10.5)

## 2012-11-16 LAB — COMPREHENSIVE METABOLIC PANEL
BUN: 15 mg/dL (ref 6–23)
CO2: 27 mEq/L (ref 19–32)
Calcium: 9.3 mg/dL (ref 8.4–10.5)
Chloride: 106 mEq/L (ref 96–112)
Creatinine, Ser: 0.7 mg/dL (ref 0.4–1.2)
GFR: 108.65 mL/min (ref >60.00)

## 2012-11-16 LAB — LDL CHOLESTEROL, DIRECT: Direct LDL: 74 mg/dL

## 2012-11-16 MED ORDER — AMOXICILLIN 500 MG PO CAPS: 1000.0000 mg | ORAL_CAPSULE | Freq: Two times a day (BID) | ORAL | Status: DC

## 2012-11-16 MED ORDER — LABETALOL HCL 100 MG PO TABS: ORAL_TABLET | ORAL | Status: DC

## 2012-11-16 NOTE — Assessment & Plan Note (Signed)
Immunizations at [facility] Gyn care Dr Ronnell Freshwater Life style is healthy labs

## 2012-11-16 NOTE — Patient Instructions (Signed)
Rest, fluids , tylenol For cough, take Mucinex  twice a day as needed   Take the antibiotic as prescribed  (Amoxicillin) if no better soon Call anytime if the symptoms are severe ---- Check the  blood pressure 2 or 3 times a week, be sure it is between 110/60 and 140/85. If it is consistently higher or lower, let me know

## 2012-11-16 NOTE — Progress Notes (Signed)
  Subjective:    Patient ID: Tanya Hicks, female    DOB: Oct 31, 1985, 27 y.o.   MRN: [id number]  HPI Physical exam A week ago developed a URI with fever, sinus and ear congestion, she feels better except for hoarseness worse for the last day. Denies postnasal dripping, no nasal discharge, + mild dry cough.  Past Medical History  Diagnosis Date  . Hypertension   . Ovarian cyst    Past Surgical History  Procedure Laterality Date  . No past surgeries     History   Social History  . Marital Status: Married    Spouse Name: N/A    Number of Children: 1  . Years of Education: N/A   Occupational History  . Nurse anethetist  [employer]   Social History Main Topics  . Smoking status: Never Smoker   . Smokeless tobacco: Never Used  . Alcohol Use: Yes     Comment: socially  . Drug Use: No  . Sexual Activity: Yes   Other Topics Concern  . Not on file   Social History Narrative   RN   Family History  Problem Relation Age of Onset  . Hypertension Father   . Heart attack Neg Hx   . Diabetes Other     GF  . Colon cancer Neg Hx   . Breast cancer Neg Hx      Review of Systems Diet-- healthy Exercise-- x 1 /week No  CP, SOB, lower extremity edema Denies  nausea, vomiting diarrhea Denies  blood in the stools No dysuria, gross hematuria, difficulty urinating   No anxiety, depression     Objective:   Physical Exam BP 112/77  Pulse 82  Temp(Src) 98.8 F (37.1 C)  Ht 5' 3.5" (1.613 m)  Wt 117 lb 12.8 oz (53.434 kg)  BMI 20.54 kg/m2  SpO2 99% General -- alert, well-developed, NAD.  HEENT-- Not pale. TMs bulge B, no red or d/c, throat symmetric, tonsils not seen, voice raspy . Face symmetric. Nose slt congested. Lungs -- normal respiratory effort, no intercostal retractions, no accessory muscle use, and normal breath sounds.  Heart-- normal rate, regular rhythm, no murmur.  Abdomen-- Not distended, good bowel sounds,soft, non-tender.  Extremities-- no pretibial edema  bilaterally  Neurologic--  alert & oriented X3. Speech normal, gait normal, strength normal in all extremities.   Psych-- Cognition and judgment appear intact. Cooperative with normal attention span and concentration. No anxious appearing , no depressed appearing.     Assessment & Plan:

## 2012-11-16 NOTE — Telephone Encounter (Signed)
Unable to reach prior to visit  

## 2012-11-16 NOTE — Assessment & Plan Note (Signed)
BPs well-controlled with labetalol 100 mg twice a day. No change, labs, refill for one year. Recommend to monitor BP in the ambulatory setting.

## 2012-11-17 ENCOUNTER — Encounter: Payer: Self-pay | Admitting: Internal Medicine

## 2012-11-20 ENCOUNTER — Ambulatory Visit: Payer: 59

## 2012-11-20 DIAGNOSIS — Z0000 Encounter for general adult medical examination without abnormal findings: Secondary | ICD-10-CM

## 2012-11-21 LAB — LIPID PANEL
Cholesterol: 163 mg/dL (ref 0–200)
LDL Cholesterol: 81 mg/dL (ref 0–99)
Triglycerides: 29 mg/dL (ref 0.0–149.0)
VLDL: 5.8 mg/dL (ref 0.0–40.0)

## 2012-12-20 ENCOUNTER — Other Ambulatory Visit: Payer: Self-pay

## 2013-01-24 ENCOUNTER — Encounter (HOSPITAL_COMMUNITY): Payer: Self-pay | Admitting: Emergency Medicine

## 2013-01-24 ENCOUNTER — Emergency Department (HOSPITAL_COMMUNITY)
Admission: EM | Admit: 2013-01-24 | Discharge: 2013-01-24 | Disposition: A | Discharge status: Home / Self Care | Payer: 59 | Source: Home / Self Care | Attending: Family Medicine | Admitting: Family Medicine

## 2013-01-24 DIAGNOSIS — N200 Calculus of kidney: Secondary | ICD-10-CM

## 2013-01-24 LAB — POCT I-STAT, CHEM 8
BUN: 22 mg/dL (ref 6–23)
Calcium, Ion: 1.18 mmol/L (ref 1.12–1.23)
Chloride: 101 mEq/L (ref 96–112)
HCT: 42 % (ref 36.0–46.0)
Potassium: 3.6 mEq/L (ref 3.5–5.1)

## 2013-01-24 LAB — POCT URINALYSIS DIP (DEVICE)
Glucose, UA: NEGATIVE mg/dL
Hgb urine dipstick: NEGATIVE
Leukocytes, UA: NEGATIVE
Urobilinogen, UA: 0.2 mg/dL (ref 0.0–1.0)
pH: 6.5 (ref 5.0–8.0)

## 2013-01-24 LAB — CBC
Hemoglobin: 14.4 g/dL (ref 12.0–15.0)
MCH: 31 pg (ref 26.0–34.0)
MCHC: 35.6 g/dL (ref 30.0–36.0)

## 2013-01-24 LAB — POCT PREGNANCY, URINE: Preg Test, Ur: NEGATIVE

## 2013-01-24 MED ORDER — HYDROCODONE-ACETAMINOPHEN 5-325 MG PO TABS: 1.0000 | ORAL_TABLET | Freq: Four times a day (QID) | ORAL | Status: DC | PRN

## 2013-01-24 MED ORDER — ONDANSETRON HCL 8 MG PO TABS
8.0000 mg | ORAL_TABLET | Freq: Three times a day (TID) | ORAL | Status: DC | PRN
Start: 2013-01-24 — End: 2014-01-07

## 2013-01-24 MED ORDER — ONDANSETRON 4 MG PO TBDP
ORAL_TABLET | ORAL | Status: AC
  Filled 2013-01-24: qty 2

## 2013-01-24 MED ORDER — ONDANSETRON 4 MG PO TBDP
8.0000 mg | ORAL_TABLET | Freq: Once | ORAL | Status: AC
  Administered 2013-01-24: 8 mg via ORAL

## 2013-01-24 MED ORDER — KETOROLAC TROMETHAMINE 60 MG/2ML IM SOLN
INTRAMUSCULAR | Status: AC
  Filled 2013-01-24: qty 2

## 2013-01-24 MED ORDER — KETOROLAC TROMETHAMINE 60 MG/2ML IM SOLN
60.0000 mg | Freq: Once | INTRAMUSCULAR | Status: AC
  Administered 2013-01-24: 60 mg via INTRAMUSCULAR

## 2013-01-24 MED ORDER — TAMSULOSIN HCL 0.4 MG PO CAPS: 0.4000 mg | ORAL_CAPSULE | Freq: Every day | ORAL | Status: DC

## 2013-01-24 NOTE — ED Notes (Signed)
Pt  Reports   Pain       r        Flank        With  Nausea          Sudden  Onset    Today

## 2013-01-24 NOTE — ED Provider Notes (Signed)
Tanya Hicks is a 27 y.o. female who presents to Urgent Care today for acute onset of severe right flank pain. Patient notes acute onset of right flank pain starting about 30 minutes ago. She denies any injury. The pain radiates to her right groin. She denies any fevers chills nausea vomiting or diarrhea. The pain is severe. They're no alleviating or exacerbating factors. He has no past history significant for kidney stone or pyelonephritis. She does note mild burning with urination about 3 days ago that has since resolved.    Past Medical History  Diagnosis Date  . Hypertension   . Ovarian cyst    History  Substance Use Topics  . Smoking status: Never Smoker   . Smokeless tobacco: Never Used  . Alcohol Use: Yes     Comment: socially   ROS as above Medications reviewed. No current facility-administered medications for this encounter.   Current Outpatient Prescriptions  Medication Sig Dispense Refill  . HYDROcodone-acetaminophen (NORCO/VICODIN) 5-325 MG per tablet Take 1 tablet by mouth every 6 (six) hours as needed.  15 tablet  0  . labetalol (NORMODYNE) 100 MG tablet TAKE 1 TAB BY MOUTH TWICE DAILY  180 tablet  3  . ondansetron (ZOFRAN) 8 MG tablet Take 1 tablet (8 mg total) by mouth every 8 (eight) hours as needed for nausea or vomiting.  20 tablet  0  . Prenatal Vit-Fe Fumarate-FA (PRENATAL MULTIVITAMIN) TABS Take 1 tablet by mouth at bedtime.      . tamsulosin (FLOMAX) 0.4 MG CAPS capsule Take 1 capsule (0.4 mg total) by mouth daily.  30 capsule  0    Exam:  BP 151/109  Pulse 82  Temp(Src) 98.1 F (36.7 C) (Oral)  Resp 16  SpO2 98%  Breastfeeding? Yes Gen: Well NAD HEENT:  MMM Lungs: Normal work of breathing. CTABL Heart: RRR no MRG Abd: NABS, Soft. NT, ND, no rebound or guarding. Mild right-sided CVA tenderness to percussion. Exts: Non edematous BL  LE, warm and well perfused.   Patient was given 8 mg of sublingual Zofran, and 60 mg of IM Toradol. Patient had  considerable resolution of symptoms with this medication administration  Results for orders placed during the hospital encounter of 01/24/13 (from the past 24 hour(s))  CBC     Status: None   Collection Time    01/24/13  1:09 PM      Result Value Range   WBC 7.3  4.0 - 10.5 K/uL   RBC 4.65  3.87 - 5.11 MIL/uL   Hemoglobin 14.4  12.0 - 15.0 g/dL   HCT 40.9  81.1 - 91.4 %   MCV 87.1  78.0 - 100.0 fL   MCH 31.0  26.0 - 34.0 pg   MCHC 35.6  30.0 - 36.0 g/dL   RDW 78.2  95.6 - 21.3 %   Platelets 294  150 - 400 K/uL  POCT URINALYSIS DIP (DEVICE)     Status: None   Collection Time    01/24/13  1:10 PM      Result Value Range   Glucose, UA NEGATIVE  NEGATIVE mg/dL   Bilirubin Urine NEGATIVE  NEGATIVE   Ketones, ur NEGATIVE  NEGATIVE mg/dL   Specific Gravity, Urine >=1.030  1.005 - 1.030   Hgb urine dipstick NEGATIVE  NEGATIVE   pH 6.5  5.0 - 8.0   Protein, ur NEGATIVE  NEGATIVE mg/dL   Urobilinogen, UA 0.2  0.0 - 1.0 mg/dL   Nitrite NEGATIVE  NEGATIVE  Leukocytes, UA NEGATIVE  NEGATIVE  POCT PREGNANCY, URINE     Status: None   Collection Time    01/24/13  1:12 PM      Result Value Range   Preg Test, Ur NEGATIVE  NEGATIVE  POCT I-STAT, CHEM 8     Status: Abnormal   Collection Time    01/24/13  1:22 PM      Result Value Range   Sodium 141  135 - 145 mEq/L   Potassium 3.6  3.5 - 5.1 mEq/L   Chloride 101  96 - 112 mEq/L   BUN 22  6 - 23 mg/dL   Creatinine, Ser 0.10  0.50 - 1.10 mg/dL   Glucose, Bld 272 (*) 70 - 99 mg/dL   Calcium, Ion 5.36  6.44 - 1.23 mmol/L   TCO2 28  0 - 100 mmol/L   Hemoglobin 14.3  12.0 - 15.0 g/dL   HCT 03.4  74.2 - 59.5 %   No results found.  Assessment and Plan: 27 y.o. female with right severe flank pain consistent with kidney stone. Plan to treat with Flomax, NSAIDs, Norco and Zofran. Patient feels much better following IM injection of Toradol.  Patient was given a strainer and will followup with urology.  Recommend against breast feeding well  taking Flomax. Discussed warning signs or symptoms. Please see discharge instructions. Patient expresses understanding.      Rodolph Bong, MD 01/24/13 1420

## 2013-03-19 ENCOUNTER — Other Ambulatory Visit: Payer: Self-pay | Admitting: *Deleted

## 2013-03-19 MED ORDER — LABETALOL HCL 100 MG PO TABS: ORAL_TABLET | ORAL | Status: DC

## 2013-10-04 ENCOUNTER — Other Ambulatory Visit: Payer: Self-pay | Admitting: Internal Medicine

## 2013-11-29 ENCOUNTER — Other Ambulatory Visit: Payer: Self-pay

## 2013-12-05 ENCOUNTER — Other Ambulatory Visit: Payer: Self-pay | Admitting: Internal Medicine

## 2013-12-16 ENCOUNTER — Encounter (HOSPITAL_COMMUNITY): Payer: Self-pay | Admitting: Emergency Medicine

## 2014-01-07 ENCOUNTER — Encounter: Payer: Self-pay | Admitting: Internal Medicine

## 2014-01-07 ENCOUNTER — Ambulatory Visit (INDEPENDENT_AMBULATORY_CARE_PROVIDER_SITE_OTHER): Payer: 59 | Admitting: Internal Medicine

## 2014-01-07 VITALS — BP 120/77 | HR 71 | Temp 97.9°F | Wt 134.5 lb

## 2014-01-07 DIAGNOSIS — Z0000 Encounter for general adult medical examination without abnormal findings: Secondary | ICD-10-CM

## 2014-01-07 LAB — COMPREHENSIVE METABOLIC PANEL
ALK PHOS: 50 U/L (ref 39–117)
ALT: 19 U/L (ref 0–35)
AST: 18 U/L (ref 0–37)
Albumin: 4.2 g/dL (ref 3.5–5.2)
BILIRUBIN TOTAL: 0.7 mg/dL (ref 0.2–1.2)
BUN: 9 mg/dL (ref 6–23)
CO2: 24 mEq/L (ref 19–32)
CREATININE: 0.6 mg/dL (ref 0.4–1.2)
Calcium: 9.1 mg/dL (ref 8.4–10.5)
Chloride: 102 mEq/L (ref 96–112)
GFR: 129.53 mL/min (ref >60.00)
Glucose, Bld: 75 mg/dL (ref 70–99)
Potassium: 3.7 mEq/L (ref 3.5–5.1)
Sodium: 136 mEq/L (ref 135–145)
Total Protein: 7 g/dL (ref 6.0–8.3)

## 2014-01-07 LAB — TSH: TSH: 0.91 u[IU]/mL (ref 0.35–4.50)

## 2014-01-07 LAB — CBC WITH DIFFERENTIAL/PLATELET
BASOS PCT: 0.3 % (ref 0.0–3.0)
Basophils Absolute: 0 10*3/uL (ref 0.0–0.1)
EOS ABS: 0.1 10*3/uL (ref 0.0–0.7)
Eosinophils Relative: 1 % (ref 0.0–5.0)
HCT: 40.8 % (ref 36.0–46.0)
HEMOGLOBIN: 14 g/dL (ref 12.0–15.0)
Lymphocytes Relative: 24.4 % (ref 12.0–46.0)
Lymphs Abs: 1.7 10*3/uL (ref 0.7–4.0)
MCHC: 34.3 g/dL (ref 30.0–36.0)
MCV: 91.4 fl (ref 78.0–100.0)
MONO ABS: 0.4 10*3/uL (ref 0.1–1.0)
Monocytes Relative: 5 % (ref 3.0–12.0)
NEUTROS ABS: 4.9 10*3/uL (ref 1.4–7.7)
Neutrophils Relative %: 69.3 % (ref 43.0–77.0)
Platelets: 297 10*3/uL (ref 150.0–400.0)
RBC: 4.47 Mil/uL (ref 3.87–5.11)
RDW: 12.2 % (ref 11.5–15.5)
WBC: 7.1 10*3/uL (ref 4.0–10.5)

## 2014-01-07 MED ORDER — LABETALOL HCL 100 MG PO TABS: 100.0000 mg | ORAL_TABLET | Freq: Two times a day (BID) | ORAL | Status: DC

## 2014-01-07 NOTE — Progress Notes (Signed)
   Subjective:    Patient ID: Tanya Hicks, female    DOB: April 29, 1985, 28 y.o.   MRN: [id number]  DOS:  01/07/2014 Type of visit - description : cpx Interval history: Patient is [redacted] weeks pregnant, feeling great. Ambulatory BPs well-controlled.   ROS No  CP, SOB No palpitations, no lower extremity edema  (-) cough, sputum production (-) wheezing, chest congestion No dysuria, gross hematuria, difficulty urinating  No anxiety, depression    Past Medical History  Diagnosis Date  . Hypertension   . Ovarian cyst   . Kidney stone 01-2014    Past Surgical History  Procedure Laterality Date  . No past surgeries      History   Social History  . Marital Status: Married    Spouse Name: N/A    Number of Children: 1  . Years of Education: N/A   Occupational History  . Nurse anethetist  [employer]   Social History Main Topics  . Smoking status: Never Smoker   . Smokeless tobacco: Never Used  . Alcohol Use: Yes     Comment: socially  . Drug Use: No  . Sexual Activity: Yes   Other Topics Concern  . 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 Hypertension Father   . Heart attack Neg Hx   . Diabetes Other     GF  . Colon cancer Neg Hx   . Breast cancer Neg Hx        Medication List       This list is accurate as of: 01/07/14  5:17 PM.  Always use your most recent med list.               labetalol 100 MG tablet  Commonly known as:  NORMODYNE  Take 1 tablet (100 mg total) by mouth 2 (two) times daily.     prenatal multivitamin Tabs tablet  Take 1 tablet by mouth at bedtime.           Objective:   Physical Exam BP 120/77 mmHg  Pulse 71  Temp(Src) 97.9 F (36.6 C) (Oral)  Wt 134 lb 8 oz (61.009 kg)  SpO2 100%  Breastfeeding? No  General -- alert, well-developed, NAD.  Neck --no thyromegaly  HEENT-- Not pale.  Lungs -- normal respiratory effort, no intercostal  retractions, no accessory muscle use, and normal breath sounds.  Heart-- normal rate, regular rhythm, no murmur.  Abdomen-- Not distended, good bowel sounds,soft, non-tender. Extremities-- no pretibial edema bilaterally  Neurologic--  alert & oriented X3. Speech normal, gait appropriate for age, strength symmetric and appropriate for age.  Psych-- Cognition and judgment appear intact. Cooperative with normal attention span and concentration. No anxious or depressed appearing.       Assessment & Plan:

## 2014-01-07 NOTE — Assessment & Plan Note (Addendum)
Immunizations at [facility] . Got a flu shot already Gyn care Dr Wynn Maudlin Life style is healthy labs BP seems to be under excellent control, continue labetalol. BP to be monitored by gynecology, I will be available if needed.

## 2014-01-07 NOTE — Patient Instructions (Signed)
    Get your blood work before you leave    Please come back to the office in 1 year  for a physical exam. Come back fasting    

## 2014-01-07 NOTE — Progress Notes (Signed)
Pre visit review using our clinic review tool, if applicable. No additional management support is needed unless otherwise documented below in the visit note. 

## 2014-01-14 DIAGNOSIS — N200 Calculus of kidney: Secondary | ICD-10-CM

## 2014-01-14 HISTORY — DX: Calculus of kidney: N20.0

## 2014-01-15 LAB — OB RESULTS CONSOLE ABO/RH: RH TYPE: POSITIVE

## 2014-01-15 LAB — OB RESULTS CONSOLE ANTIBODY SCREEN: Antibody Screen: NEGATIVE

## 2014-01-15 LAB — OB RESULTS CONSOLE GC/CHLAMYDIA
Chlamydia: NEGATIVE
Gonorrhea: NEGATIVE

## 2014-01-15 LAB — OB RESULTS CONSOLE HEPATITIS B SURFACE ANTIGEN: HEP B S AG: NEGATIVE

## 2014-01-15 LAB — OB RESULTS CONSOLE RUBELLA ANTIBODY, IGM: Rubella: IMMUNE

## 2014-02-05 ENCOUNTER — Emergency Department (HOSPITAL_COMMUNITY)
Admission: EM | Admit: 2014-02-05 | Discharge: 2014-02-05 | Disposition: A | Discharge status: Home / Self Care | Payer: 59 | Source: Home / Self Care

## 2014-02-14 NOTE — L&D Delivery Note (Signed)
Delivery Note At 7:01 AM a viable and healthy female was delivered via Vaginal, Spontaneous Delivery (Presentation: Middle Occiput Anterior).  APGAR: 8, 9; weight P.   Placenta status: Intact, Expressed  Cord: 3 vessels with the following complications: Nuchal x 1.  Anesthesia: Epidural  Episiotomy: None Lacerations: None Suture Repair: N/A Est. Blood Loss (mL):  284  Mom to postpartum.  Baby to Couplet care / Skin to Skin.  Bovard-Stuckert, Tanya Hicks 08/14/2014, 7:20 AM  Br/O+/Tdap in PNC/RI/Contra?  D/w parents circumcision for female infant, including r/b/a - will proceed

## 2014-06-03 LAB — OB RESULTS CONSOLE RPR: RPR: NONREACTIVE

## 2014-06-03 LAB — OB RESULTS CONSOLE HIV ANTIBODY (ROUTINE TESTING): HIV: NONREACTIVE

## 2014-08-01 LAB — OB RESULTS CONSOLE GBS: STREP GROUP B AG: NEGATIVE

## 2014-08-13 ENCOUNTER — Inpatient Hospital Stay (HOSPITAL_COMMUNITY)
Admission: AD | Admit: 2014-08-13 | Discharge: 2014-08-15 | DRG: 774 | Disposition: A | Discharge status: Home / Self Care | Payer: 59 | Source: Ambulatory Visit | Attending: Obstetrics and Gynecology | Admitting: Obstetrics and Gynecology

## 2014-08-13 ENCOUNTER — Encounter (HOSPITAL_COMMUNITY): Payer: Self-pay

## 2014-08-13 DIAGNOSIS — O4290 Premature rupture of membranes, unspecified as to length of time between rupture and onset of labor, unspecified weeks of gestation: Secondary | ICD-10-CM

## 2014-08-13 DIAGNOSIS — O1092 Unspecified pre-existing hypertension complicating childbirth: Secondary | ICD-10-CM | POA: Diagnosis present

## 2014-08-13 DIAGNOSIS — Z3483 Encounter for supervision of other normal pregnancy, third trimester: Secondary | ICD-10-CM | POA: Diagnosis present

## 2014-08-13 DIAGNOSIS — O4292 Full-term premature rupture of membranes, unspecified as to length of time between rupture and onset of labor: Principal | ICD-10-CM | POA: Diagnosis present

## 2014-08-13 DIAGNOSIS — Z3A38 38 weeks gestation of pregnancy: Secondary | ICD-10-CM | POA: Diagnosis present

## 2014-08-13 HISTORY — DX: Premature rupture of membranes, unspecified as to length of time between rupture and onset of labor, unspecified weeks of gestation: O42.90

## 2014-08-13 HISTORY — DX: Gestational (pregnancy-induced) hypertension without significant proteinuria, unspecified trimester: O13.9

## 2014-08-13 LAB — CBC
HEMATOCRIT: 35.1 % — AB (ref 36.0–46.0)
Hemoglobin: 11.9 g/dL — ABNORMAL LOW (ref 12.0–15.0)
MCH: 31.6 pg (ref 26.0–34.0)
MCHC: 33.9 g/dL (ref 30.0–36.0)
MCV: 93.4 fL (ref 78.0–100.0)
Platelets: 200 10*3/uL (ref 150–400)
RBC: 3.76 MIL/uL — ABNORMAL LOW (ref 3.87–5.11)
RDW: 14.6 % (ref 11.5–15.5)
WBC: 13.6 10*3/uL — ABNORMAL HIGH (ref 4.0–10.5)

## 2014-08-13 LAB — POCT FERN TEST: POCT Fern Test: POSITIVE

## 2014-08-13 LAB — TYPE AND SCREEN
ABO/RH(D): O POS
ANTIBODY SCREEN: NEGATIVE

## 2014-08-13 MED ORDER — OXYCODONE-ACETAMINOPHEN 5-325 MG PO TABS
1.0000 | ORAL_TABLET | ORAL | Status: DC | PRN
  Administered 2014-08-14: 1 via ORAL
  Filled 2014-08-13: qty 1

## 2014-08-13 MED ORDER — CITRIC ACID-SODIUM CITRATE 334-500 MG/5ML PO SOLN: 30.0000 mL | ORAL | Status: DC | PRN

## 2014-08-13 MED ORDER — LACTATED RINGERS IV SOLN
500.0000 mL | INTRAVENOUS | Status: DC | PRN
  Administered 2014-08-14: 500 mL via INTRAVENOUS

## 2014-08-13 MED ORDER — TERBUTALINE SULFATE 1 MG/ML IJ SOLN: 0.2500 mg | Freq: Once | INTRAMUSCULAR | Status: AC | PRN

## 2014-08-13 MED ORDER — ONDANSETRON HCL 4 MG/2ML IJ SOLN
4.0000 mg | Freq: Four times a day (QID) | INTRAMUSCULAR | Status: DC | PRN
  Administered 2014-08-14: 4 mg via INTRAVENOUS
  Filled 2014-08-13: qty 2

## 2014-08-13 MED ORDER — ACETAMINOPHEN 325 MG PO TABS: 650.0000 mg | ORAL_TABLET | ORAL | Status: DC | PRN

## 2014-08-13 MED ORDER — OXYTOCIN 40 UNITS IN LACTATED RINGERS INFUSION - SIMPLE MED: 62.5000 mL/h | INTRAVENOUS | Status: DC

## 2014-08-13 MED ORDER — LACTATED RINGERS IV SOLN
INTRAVENOUS | Status: DC
  Administered 2014-08-14: 02:00:00 via INTRAVENOUS

## 2014-08-13 MED ORDER — OXYTOCIN BOLUS FROM INFUSION
500.0000 mL | INTRAVENOUS | Status: DC
  Administered 2014-08-14: 500 mL via INTRAVENOUS

## 2014-08-13 MED ORDER — OXYCODONE-ACETAMINOPHEN 5-325 MG PO TABS: 2.0000 | ORAL_TABLET | ORAL | Status: DC | PRN

## 2014-08-13 MED ORDER — OXYTOCIN 40 UNITS IN LACTATED RINGERS INFUSION - SIMPLE MED
1.0000 m[IU]/min | INTRAVENOUS | Status: DC
  Administered 2014-08-13: 2 m[IU]/min via INTRAVENOUS
  Filled 2014-08-13: qty 1000

## 2014-08-13 MED ORDER — LIDOCAINE HCL (PF) 1 % IJ SOLN
30.0000 mL | INTRAMUSCULAR | Status: DC | PRN
  Filled 2014-08-13: qty 30

## 2014-08-13 MED ORDER — BUTORPHANOL TARTRATE 1 MG/ML IJ SOLN
1.0000 mg | INTRAMUSCULAR | Status: DC | PRN
Start: 2014-08-13 — End: 2014-08-14

## 2014-08-13 NOTE — H&P (Signed)
Tanya Hicks is a 29 y.o. female G2P1001 at 104+ with ROM.  Clear fluid around 7pm.  +FM, cont LOF - confirmed ROM in MAU, no VB, occ ctx; Relatively uncomplicate prenatal care except patient with CHTN, controlled with Labetalol 100 mg bid.  GBBS negative.  Maternal Medical History:  Reason for admission: Rupture of membranes.   Contractions: Frequency: irregular.   Perceived severity is moderate.    Fetal activity: Perceived fetal activity is normal.    Prenatal Complications - Diabetes: none.    OB History    Gravida Para Term Preterm AB TAB SAB Ectopic Multiple Living   2 1 1  0 0 0 0 0 0 1    G1 38wk, 5#14, female, SVD G2 present  H/o abn pap, repeat nl No STD  Past Medical History  Diagnosis Date  . Hypertension   . Ovarian cyst   . Kidney stone 01-2014  . Pregnancy induced hypertension   . PROM (premature rupture of membranes) 08/13/2014   Past Surgical History  Procedure Laterality Date  . Wisdom tooth extraction     Family History: family history includes Diabetes in her other; Hypertension in her father. There is no history of Heart attack, Colon cancer, or Breast cancer. Social History:  reports that she has never smoked. She has never used smokeless tobacco. She reports that she does not drink alcohol or use illicit drugs.CRNA, married Meds PNV, Labetalol All NKDA    Prenatal Transfer Tool  Maternal Diabetes: No Genetic Screening: Normal Maternal Ultrasounds/Referrals: Normal Fetal Ultrasounds or other Referrals:  None Maternal Substance Abuse:  No Significant Maternal Medications:  Meds include: Other: Labetalol Significant Maternal Lab Results:  Lab values include: Group B Strep negative Other Comments:  None  Review of Systems  Constitutional: Negative.   HENT: Negative.   Eyes: Negative.   Respiratory: Negative.   Cardiovascular: Negative.   Gastrointestinal: Negative.   Genitourinary: Negative.   Musculoskeletal: Negative.   Skin: Negative.    Neurological: Negative.   Psychiatric/Behavioral: Negative.     Dilation: 3 Effacement (%): 70 Station: -2 Exam by:: Jorje Guild RN Blood pressure 139/87, pulse 91, temperature 98.7 F (37.1 C), temperature source Oral, resp. rate 18, height 5\' 3"  (1.6 m), weight 78.472 kg (173 lb), SpO2 99 %. Maternal Exam:  Abdomen: Patient reports no abdominal tenderness. Fundal height is appropriate for gestation.   Estimated fetal weight is 7-7.5#.   Fetal presentation: vertex  Introitus: Normal vulva. Normal vagina.  Cervix: Cervix evaluated by digital exam.     Physical Exam  Constitutional: She is oriented to person, place, and time. She appears well-developed and well-nourished.  HENT:  Head: Normocephalic and atraumatic.  Cardiovascular: Normal rate and regular rhythm.   Respiratory: Effort normal and breath sounds normal. No respiratory distress. She has no wheezes.  GI: Soft. Bowel sounds are normal. She exhibits no distension. There is no tenderness.  Musculoskeletal: Normal range of motion.  Neurological: She is alert and oriented to person, place, and time.  Skin: Skin is warm and dry.  Psychiatric: She has a normal mood and affect. Her behavior is normal.    Prenatal labs: ABO, Rh:  O+  Antibody:  negative Rubella:  immune RPR:   NR HBsAg:   neg HIV:   neg GBS:   neg  Hgb 13.6/Plt 289K/GC neg/ Chl neg/Varicella immune/First Tri and AFP WNL/glucola 128/  Tdap 06/03/14  [place]: Nl NT Nl anat, previa Previa resolved S>D - good growth 5#10 at 34  wk, nl AFI, post fundal plac  Assessment/Plan: 28yo G2P1001 at 38+ with ROM augmentation with pitocin prn  Epidural prn Expect SVD  Bovard-Stuckert, Tanya Hicks 08/13/2014, 9:16 PM

## 2014-08-13 NOTE — MAU Note (Signed)
Pt reports ROM, some contractions.

## 2014-08-14 ENCOUNTER — Encounter (HOSPITAL_COMMUNITY): Payer: Self-pay | Admitting: Obstetrics and Gynecology

## 2014-08-14 ENCOUNTER — Inpatient Hospital Stay (HOSPITAL_COMMUNITY): Payer: 59 | Admitting: Anesthesiology

## 2014-08-14 LAB — RPR: RPR Ser Ql: NONREACTIVE

## 2014-08-14 MED ORDER — IBUPROFEN 600 MG PO TABS
600.0000 mg | ORAL_TABLET | Freq: Four times a day (QID) | ORAL | Status: DC
  Administered 2014-08-14 – 2014-08-15 (×6): 600 mg via ORAL
  Filled 2014-08-14 (×6): qty 1

## 2014-08-14 MED ORDER — FENTANYL 2.5 MCG/ML BUPIVACAINE 1/10 % EPIDURAL INFUSION (WH - ANES)
14.0000 mL/h | INTRAMUSCULAR | Status: DC | PRN
  Administered 2014-08-14: 14 mL/h via EPIDURAL
  Filled 2014-08-14: qty 125

## 2014-08-14 MED ORDER — DIBUCAINE 1 % RE OINT: 1.0000 "application " | TOPICAL_OINTMENT | RECTAL | Status: DC | PRN

## 2014-08-14 MED ORDER — SIMETHICONE 80 MG PO CHEW: 80.0000 mg | CHEWABLE_TABLET | ORAL | Status: DC | PRN

## 2014-08-14 MED ORDER — ZOLPIDEM TARTRATE 5 MG PO TABS: 5.0000 mg | ORAL_TABLET | Freq: Every evening | ORAL | Status: DC | PRN

## 2014-08-14 MED ORDER — ACETAMINOPHEN 325 MG PO TABS
650.0000 mg | ORAL_TABLET | ORAL | Status: DC | PRN
Start: 2014-08-14 — End: 2014-08-15

## 2014-08-14 MED ORDER — LABETALOL HCL 100 MG PO TABS
100.0000 mg | ORAL_TABLET | Freq: Two times a day (BID) | ORAL | Status: DC
  Administered 2014-08-14 – 2014-08-15 (×2): 100 mg via ORAL
  Filled 2014-08-14 (×3): qty 1

## 2014-08-14 MED ORDER — OXYCODONE-ACETAMINOPHEN 5-325 MG PO TABS
2.0000 | ORAL_TABLET | ORAL | Status: DC | PRN
Start: 2014-08-14 — End: 2014-08-15

## 2014-08-14 MED ORDER — DIPHENHYDRAMINE HCL 25 MG PO CAPS: 25.0000 mg | ORAL_CAPSULE | Freq: Four times a day (QID) | ORAL | Status: DC | PRN

## 2014-08-14 MED ORDER — LACTATED RINGERS IV SOLN: INTRAVENOUS | Status: DC

## 2014-08-14 MED ORDER — ONDANSETRON HCL 4 MG/2ML IJ SOLN: 4.0000 mg | INTRAMUSCULAR | Status: DC | PRN

## 2014-08-14 MED ORDER — DIPHENHYDRAMINE HCL 50 MG/ML IJ SOLN: 12.5000 mg | INTRAMUSCULAR | Status: DC | PRN

## 2014-08-14 MED ORDER — PRENATAL MULTIVITAMIN CH
1.0000 | ORAL_TABLET | Freq: Every day | ORAL | Status: DC
  Administered 2014-08-14 – 2014-08-15 (×2): 1 via ORAL
  Filled 2014-08-14 (×2): qty 1

## 2014-08-14 MED ORDER — PHENYLEPHRINE 40 MCG/ML (10ML) SYRINGE FOR IV PUSH (FOR BLOOD PRESSURE SUPPORT)
80.0000 ug | PREFILLED_SYRINGE | INTRAVENOUS | Status: DC | PRN
  Filled 2014-08-14: qty 20
  Filled 2014-08-14: qty 2

## 2014-08-14 MED ORDER — WITCH HAZEL-GLYCERIN EX PADS: 1.0000 "application " | MEDICATED_PAD | CUTANEOUS | Status: DC | PRN

## 2014-08-14 MED ORDER — BENZOCAINE-MENTHOL 20-0.5 % EX AERO
1.0000 "application " | INHALATION_SPRAY | CUTANEOUS | Status: DC | PRN
  Administered 2014-08-14: 1 via TOPICAL
  Filled 2014-08-14: qty 56

## 2014-08-14 MED ORDER — EPHEDRINE 5 MG/ML INJ
10.0000 mg | INTRAVENOUS | Status: DC | PRN
  Filled 2014-08-14: qty 2

## 2014-08-14 MED ORDER — SENNOSIDES-DOCUSATE SODIUM 8.6-50 MG PO TABS
2.0000 | ORAL_TABLET | ORAL | Status: DC
Start: 2014-08-15 — End: 2014-08-15
  Administered 2014-08-14: 2 via ORAL
  Filled 2014-08-14: qty 2

## 2014-08-14 MED ORDER — LIDOCAINE HCL (PF) 1 % IJ SOLN
INTRAMUSCULAR | Status: DC | PRN
  Administered 2014-08-14 (×2): 5 mL
  Administered 2014-08-14: 3 mL

## 2014-08-14 MED ORDER — ONDANSETRON HCL 4 MG PO TABS: 4.0000 mg | ORAL_TABLET | ORAL | Status: DC | PRN

## 2014-08-14 MED ORDER — LANOLIN HYDROUS EX OINT: TOPICAL_OINTMENT | CUTANEOUS | Status: DC | PRN

## 2014-08-14 MED ORDER — OXYCODONE-ACETAMINOPHEN 5-325 MG PO TABS: 1.0000 | ORAL_TABLET | ORAL | Status: DC | PRN

## 2014-08-14 NOTE — Anesthesia Preprocedure Evaluation (Signed)
Anesthesia Evaluation  Patient identified by MRN, date of birth, ID band Patient awake    Reviewed: Allergy & Precautions, H&P , NPO status , Patient's Chart, lab work & pertinent test results, reviewed documented beta blocker date and time   History of Anesthesia Complications Negative for: history of anesthetic complications  Airway Mallampati: II  TM Distance: >3 FB Neck ROM: full    Dental  (+) Teeth Intact   Pulmonary neg pulmonary ROS,  breath sounds clear to auscultation        Cardiovascular hypertension, On Home Beta Blockers and Pt. on home beta blockers Rhythm:regular Rate:Normal     Neuro/Psych negative neurological ROS  negative psych ROS   GI/Hepatic Neg liver ROS, GERD-  Medicated,  Endo/Other  negative endocrine ROS  Renal/GU negative Renal ROS     Musculoskeletal   Abdominal   Peds  Hematology negative hematology ROS (+)   Anesthesia Other Findings   Reproductive/Obstetrics (+) Pregnancy                             Anesthesia Physical  Anesthesia Plan  ASA: II  Anesthesia Plan: Epidural   Post-op Pain Management:    Induction:   Airway Management Planned:   Additional Equipment:   Intra-op Plan:   Post-operative Plan:   Informed Consent: I have reviewed the patients History and Physical, chart, labs and discussed the procedure including the risks, benefits and alternatives for the proposed anesthesia with the patient or authorized representative who has indicated his/her understanding and acceptance.     Plan Discussed with:   Anesthesia Plan Comments:         Anesthesia Quick Evaluation

## 2014-08-14 NOTE — Lactation Note (Signed)
This note was copied from the chart of [place]. Lactation Consultation Note  Patient Name: Boy Denisa Enterline [id number] Date: 08/14/2014 Reason for consult: Initial assessment Baby 46 hours old. Mom states that she pumped and bottle-fed first baby over a year due to baby's prematurity. Mom states that this baby is latching/nursing well and she cannot believe how easy it is this time. Discussed with mom how to get employee pump. Discussed breast feeding exclusively for first 3-4 weeks, and then how to introduce a bottle a day of EBM in preparation for returning to work. Mom given [facility] brochure, aware of OP/BFSG, community resources, and [facility] phone line assistance after D/C. Enc mom to call for assistance as needed.   Maternal Data Has patient been taught Hand Expression?: Yes (Per mom.) Does the patient have breastfeeding experience prior to this delivery?: Yes  Feeding Feeding Type: Breast Fed Length of feed: 20 min  LATCH Score/Interventions                      Lactation Tools Discussed/Used     Consult Status Consult Status: Follow-up Date: 08/15/14 Follow-up type: In-patient    Inocente Salles 08/14/2014, 2:36 PM

## 2014-08-14 NOTE — Anesthesia Postprocedure Evaluation (Signed)
  Anesthesia Post-op Note  Patient: Tanya Hicks  Procedure(s) Performed: * No procedures listed *  Patient Location: PACU and Mother/Baby  Anesthesia Type:Epidural  Level of Consciousness: awake, alert  and oriented  Airway and Oxygen Therapy: Patient Spontanous Breathing  Post-op Pain: none  Post-op Assessment: Post-op Vital signs reviewed, Patient's Cardiovascular Status Stable, No headache, No backache, No residual numbness and No residual motor weakness  Post-op Vital Signs: Reviewed and stable  Complications: No apparent anesthesia complications

## 2014-08-14 NOTE — Anesthesia Procedure Notes (Signed)
Epidural Patient location during procedure: OB  Staffing Anesthesiologist: Montez Hageman Performed by: anesthesiologist   Preanesthetic Checklist Completed: patient identified, site marked, surgical consent, pre-op evaluation, timeout performed, IV checked, risks and benefits discussed and monitors and equipment checked  Epidural Patient position: sitting Prep: DuraPrep Patient monitoring: heart rate, continuous pulse ox and blood pressure Approach: right paramedian Location: L3-L4 Injection technique: LOR saline  Needle:  Needle type: Tuohy  Needle gauge: 17 G Needle length: 9 cm and 9 Needle insertion depth: 4 cm Catheter type: closed end flexible Catheter size: 20 Guage Catheter at skin depth: 9 cm Test dose: negative  Assessment Events: blood not aspirated, injection not painful, no injection resistance, negative IV test and no paresthesia  Additional Notes Patient identified. Risks/Benefits/Options discussed with patient including but not limited to bleeding, infection, nerve damage, paralysis, failed block, incomplete pain control, headache, blood pressure changes, nausea, vomiting, reactions to medication both or allergic, itching and postpartum back pain. Confirmed with bedside nurse the patient's most recent platelet count. Confirmed with patient that they are not currently taking any anticoagulation, have any bleeding history or any family history of bleeding disorders. Patient expressed understanding and wished to proceed. All questions were answered. Sterile technique was used throughout the entire procedure. Please see nursing notes for vital signs. Test dose was given through epidural needle and negative prior to continuing to dose epidural or start infusion. Warning signs of high block given to the patient including shortness of breath, tingling/numbness in hands, complete motor block, or any concerning symptoms with instructions to call for help. Patient was given  instructions on fall risk and not to get out of bed. All questions and concerns addressed with instructions to call with any issues.

## 2014-08-15 LAB — CBC
HCT: 36.2 % (ref 36.0–46.0)
Hemoglobin: 11.9 g/dL — ABNORMAL LOW (ref 12.0–15.0)
MCH: 31.2 pg (ref 26.0–34.0)
MCHC: 32.9 g/dL (ref 30.0–36.0)
MCV: 95 fL (ref 78.0–100.0)
PLATELETS: 167 10*3/uL (ref 150–400)
RBC: 3.81 MIL/uL — ABNORMAL LOW (ref 3.87–5.11)
RDW: 14.8 % (ref 11.5–15.5)
WBC: 12.4 10*3/uL — ABNORMAL HIGH (ref 4.0–10.5)

## 2014-08-15 MED ORDER — PRENATAL MULTIVITAMIN CH: 1.0000 | ORAL_TABLET | Freq: Every day | ORAL | Status: DC

## 2014-08-15 MED ORDER — IBUPROFEN 800 MG PO TABS: 800.0000 mg | ORAL_TABLET | Freq: Three times a day (TID) | ORAL | Status: DC | PRN

## 2014-08-15 NOTE — Progress Notes (Addendum)
Post Partum Day 1 Subjective: no complaints, up ad lib, voiding, tolerating PO and nl lochia, pain controlled  Objective: Blood pressure 117/74, pulse 69, temperature 97.5 F (36.4 C), temperature source Oral, resp. rate 18, height 5\' 3"  (1.6 m), weight 78.472 kg (173 lb), SpO2 98 %, unknown if currently breastfeeding.  Physical Exam:  General: alert and no distress Lochia: appropriate Uterine Fundus: firm  Recent Labs  08/13/14 2044 08/15/14 0540  HGB 11.9* 11.9*  HCT 35.1* 36.2    Assessment/Plan: Plan for discharge tomorrow, Breastfeeding and Lactation consult.  Routine care.     LOS: 2 days   Hicks, Tanya Raffo 08/15/2014, 8:16 AM   Pt desires early d/c, will d/c with motrin and PNV.

## 2014-08-15 NOTE — Discharge Summary (Signed)
Obstetric Discharge Summary Reason for Admission: rupture of membranes Prenatal Procedures: none Intrapartum Procedures: spontaneous vaginal delivery Postpartum Procedures: none Complications-Operative and Postpartum: none HEMOGLOBIN  Date Value Ref Range Status  08/15/2014 11.9* 12.0 - 15.0 g/dL Final   HCT  Date Value Ref Range Status  08/15/2014 36.2 36.0 - 46.0 % Final    Physical Exam:  General: alert, cooperative and no distress Lochia: appropriate Uterine Fundus: firm   Discharge Diagnoses: Term Pregnancy-delivered  Discharge Information: Date: 08/15/2014 Activity: pelvic rest Diet: routine Medications: PNV and Ibuprofen Condition: stable Instructions: refer to practice specific booklet Discharge to: home Follow-up Information    Follow up with Bovard-Stuckert, Khyre Germond, MD. Schedule an appointment as soon as possible for a visit in 6 weeks.   Specialty:  Obstetrics and Gynecology   Why:  for postpartum check   Contact information:   [address] [address] [phone]       Newborn Data: Live born female  Birth Weight: 8 lb 5 oz (3771 g) APGAR: 8, 9  Home with mother.  Bovard-Stuckert, Mavi Un 08/15/2014, 8:49 AM

## 2015-01-14 ENCOUNTER — Ambulatory Visit (HOSPITAL_BASED_OUTPATIENT_CLINIC_OR_DEPARTMENT_OTHER)
Admission: RE | Admit: 2015-01-14 | Discharge: 2015-01-14 | Disposition: A | Discharge status: Home / Self Care | Payer: 59 | Source: Ambulatory Visit | Attending: Internal Medicine | Admitting: Internal Medicine

## 2015-01-14 ENCOUNTER — Ambulatory Visit (INDEPENDENT_AMBULATORY_CARE_PROVIDER_SITE_OTHER): Payer: 59 | Admitting: Internal Medicine

## 2015-01-14 ENCOUNTER — Encounter: Payer: Self-pay | Admitting: Internal Medicine

## 2015-01-14 VITALS — BP 116/74 | HR 96 | Temp 98.4°F | Ht 63.5 in | Wt 125.2 lb

## 2015-01-14 DIAGNOSIS — R05 Cough: Secondary | ICD-10-CM

## 2015-01-14 DIAGNOSIS — R0989 Other specified symptoms and signs involving the circulatory and respiratory systems: Secondary | ICD-10-CM | POA: Insufficient documentation

## 2015-01-14 DIAGNOSIS — I10 Essential (primary) hypertension: Secondary | ICD-10-CM | POA: Diagnosis not present

## 2015-01-14 DIAGNOSIS — R918 Other nonspecific abnormal finding of lung field: Secondary | ICD-10-CM | POA: Insufficient documentation

## 2015-01-14 DIAGNOSIS — R079 Chest pain, unspecified: Secondary | ICD-10-CM | POA: Insufficient documentation

## 2015-01-14 DIAGNOSIS — R059 Cough, unspecified: Secondary | ICD-10-CM

## 2015-01-14 DIAGNOSIS — Z09 Encounter for follow-up examination after completed treatment for conditions other than malignant neoplasm: Secondary | ICD-10-CM | POA: Insufficient documentation

## 2015-01-14 MED ORDER — AZITHROMYCIN 250 MG PO TABS: ORAL_TABLET | ORAL | Status: DC

## 2015-01-14 NOTE — Progress Notes (Signed)
Subjective:    Patient ID: Tanya Hicks, female    DOB: Aug 02, 1985, 29 y.o.   MRN: [id number]  DOS:  01/14/2015 Type of visit - description : Acute visit Interval history: Symptoms started 2 weeks ago with chest congestion, cough productive of yellowish sputum after she started taking Mucinex. A week ago developed right-sided chest pain, worse with deep breathing and coughing, now only exacerbated by cough. Ibuprofen helps.   Review of Systems Denies fever chills No nausea or vomiting. Mild myalgias  Past Medical History  Diagnosis Date  . Hypertension   . Ovarian cyst   . Kidney stone 01-2014  . Pregnancy induced hypertension   . PROM (premature rupture of membranes) 08/13/2014  . SVD (spontaneous vaginal delivery) 08/14/2014    Past Surgical History  Procedure Laterality Date  . Wisdom tooth extraction      Social History   Social History  . Marital Status: Married    Spouse Name: N/A  . Number of Children: 2  . Years of Education: N/A   Occupational History  . Nurse anethetist  [employer]   Social History Main Topics  . Smoking status: Never Smoker   . Smokeless tobacco: Never Used  . Alcohol Use: No     Comment: socially  . Drug Use: No  . Sexual Activity: Yes   Other Topics Concern  . 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  Always use your most recent med list.               azithromycin 250 MG tablet  Commonly known as:  ZITHROMAX Z-PAK  2 tabs a day the first day, then 1 tab a day x 4 days     ibuprofen 800 MG tablet  Commonly known as:  ADVIL,MOTRIN  Take 1 tablet (800 mg total) by mouth every 8 (eight) hours as needed.     labetalol 100 MG tablet  Commonly known as:  NORMODYNE  Take 1 tablet (100 mg total) by mouth 2 (two) times daily.     prenatal multivitamin Tabs tablet  Take 1 tablet by mouth at bedtime.             Objective:   Physical Exam BP 116/74 mmHg  Pulse 96  Temp(Src) 98.4 F (36.9 C) (Oral)  Ht 5' 3.5" (1.613 m)  Wt 125 lb 4 oz (56.813 kg)  BMI 21.84 kg/m2  SpO2 96%  Breastfeeding? Yes General:   Well developed, well nourished . NAD.  HEENT:  Normocephalic . Face symmetric, atraumatic. TMs bulge B, slightly red on the right. Lungs:  ? crackles at the right base. Normal respiratory effort, no intercostal retractions, no accessory muscle use. Heart: RRR,  no murmur.  No pretibial edema bilaterally  Skin: Not pale. Not jaundice Neurologic:  alert & oriented X3.  Speech normal, gait appropriate for age and unassisted Psych--  Cognition and judgment appear intact.  Cooperative with normal attention span and concentration.  Behavior appropriate. No anxious or depressed appearing.      Assessment & Plan:   Assessment> HTN Ovarian cyst Kidney stone 01/2014 Husband had a vasectomy 12-2014  Plan: Cough: Cough for 2 weeks, right-sided chest pain and crackles on exam, suspected pneumonia. Chest x-ray showing no pneumonia on the right side and a question of  PTX versus pneumonitis on the left. Plan: Start Zithromax, it is an acceptable choice w/ breast-feeding Recheck a chest x-ray in 5 weeks. Patient in agreement.  Marland Kitchenlastchc

## 2015-01-14 NOTE — Assessment & Plan Note (Signed)
Cough: Cough for 2 weeks, right-sided chest pain and crackles on exam, suspected pneumonia. Chest x-ray showing no pneumonia on the right side and a question of PTX versus pneumonitis on the left. Plan: Start Zithromax, it is an acceptable choice w/ breast-feeding Recheck a chest x-ray in 5 weeks. Patient in agreement.

## 2015-01-14 NOTE — Progress Notes (Signed)
Pre visit review using our clinic review tool, if applicable. No additional management support is needed unless otherwise documented below in the visit note. 

## 2015-01-14 NOTE — Patient Instructions (Signed)
  Stop by the first floor and get the XR   Continue with Robitussin-DM, okay to take Tylenol PM for congestion. Drink plenty of fluids   Call if not gradually improving in the next few days or if you have severe symptoms.

## 2015-03-24 ENCOUNTER — Encounter: Payer: 59 | Admitting: Internal Medicine

## 2015-03-26 ENCOUNTER — Encounter: Payer: Self-pay | Admitting: Internal Medicine

## 2015-04-06 ENCOUNTER — Ambulatory Visit: Payer: Self-pay | Admitting: Internal Medicine

## 2015-04-13 ENCOUNTER — Encounter: Payer: 59 | Admitting: Internal Medicine

## 2015-04-29 DIAGNOSIS — Z1151 Encounter for screening for human papillomavirus (HPV): Secondary | ICD-10-CM | POA: Diagnosis not present

## 2015-04-29 DIAGNOSIS — Z130 Encounter for screening for diseases of the blood and blood-forming organs and certain disorders involving the immune mechanism: Secondary | ICD-10-CM | POA: Diagnosis not present

## 2015-04-29 DIAGNOSIS — Z1389 Encounter for screening for other disorder: Secondary | ICD-10-CM | POA: Diagnosis not present

## 2015-04-29 DIAGNOSIS — Z124 Encounter for screening for malignant neoplasm of cervix: Secondary | ICD-10-CM | POA: Diagnosis not present

## 2015-04-29 DIAGNOSIS — Z01419 Encounter for gynecological examination (general) (routine) without abnormal findings: Secondary | ICD-10-CM | POA: Diagnosis not present

## 2015-04-29 DIAGNOSIS — Z6822 Body mass index (BMI) 22.0-22.9, adult: Secondary | ICD-10-CM | POA: Diagnosis not present

## 2015-04-30 DIAGNOSIS — Z124 Encounter for screening for malignant neoplasm of cervix: Secondary | ICD-10-CM | POA: Diagnosis not present

## 2015-04-30 LAB — HM PAP SMEAR

## 2015-06-17 ENCOUNTER — Encounter: Payer: Self-pay | Admitting: Internal Medicine

## 2015-06-17 ENCOUNTER — Ambulatory Visit (INDEPENDENT_AMBULATORY_CARE_PROVIDER_SITE_OTHER): Payer: 59 | Admitting: Internal Medicine

## 2015-06-17 VITALS — BP 120/68 | HR 67 | Temp 98.0°F | Ht 63.5 in | Wt 122.4 lb

## 2015-06-17 DIAGNOSIS — Z0000 Encounter for general adult medical examination without abnormal findings: Secondary | ICD-10-CM

## 2015-06-17 DIAGNOSIS — Z09 Encounter for follow-up examination after completed treatment for conditions other than malignant neoplasm: Secondary | ICD-10-CM

## 2015-06-17 LAB — CBC WITH DIFFERENTIAL/PLATELET
Basophils Absolute: 0 10*3/uL (ref 0.0–0.1)
Basophils Relative: 0.6 % (ref 0.0–3.0)
Eosinophils Absolute: 0.3 10*3/uL (ref 0.0–0.7)
Eosinophils Relative: 4.8 % (ref 0.0–5.0)
HCT: 40.1 % (ref 36.0–46.0)
HEMOGLOBIN: 13.8 g/dL (ref 12.0–15.0)
LYMPHS PCT: 36.4 % (ref 12.0–46.0)
Lymphs Abs: 1.9 10*3/uL (ref 0.7–4.0)
MCHC: 34.5 g/dL (ref 30.0–36.0)
MCV: 88.9 fl (ref 78.0–100.0)
MONOS PCT: 5.8 % (ref 3.0–12.0)
Monocytes Absolute: 0.3 10*3/uL (ref 0.1–1.0)
NEUTROS ABS: 2.8 10*3/uL (ref 1.4–7.7)
Neutrophils Relative %: 52.4 % (ref 43.0–77.0)
PLATELETS: 248 10*3/uL (ref 150.0–400.0)
RBC: 4.51 Mil/uL (ref 3.87–5.11)
RDW: 12.2 % (ref 11.5–15.5)
WBC: 5.3 10*3/uL (ref 4.0–10.5)

## 2015-06-17 LAB — BASIC METABOLIC PANEL WITH GFR
BUN: 17 mg/dL (ref 6–23)
CO2: 29 meq/L (ref 19–32)
Calcium: 9.7 mg/dL (ref 8.4–10.5)
Chloride: 102 meq/L (ref 96–112)
Creatinine, Ser: 0.64 mg/dL (ref 0.40–1.20)
GFR: 114.53 mL/min
Glucose, Bld: 82 mg/dL (ref 70–99)
Potassium: 3.8 meq/L (ref 3.5–5.1)
Sodium: 139 meq/L (ref 135–145)

## 2015-06-17 LAB — LIPID PANEL
Cholesterol: 172 mg/dL (ref 0–200)
HDL: 83.4 mg/dL (ref >39.00)
LDL Cholesterol: 83 mg/dL (ref 0–99)
NONHDL: 89
Total CHOL/HDL Ratio: 2
Triglycerides: 32 mg/dL (ref 0.0–149.0)
VLDL: 6.4 mg/dL (ref 0.0–40.0)

## 2015-06-17 MED ORDER — FLUTICASONE PROPIONATE 50 MCG/ACT NA SUSP: 2.0000 | Freq: Every day | NASAL | Status: DC

## 2015-06-17 MED ORDER — METOPROLOL SUCCINATE ER 50 MG PO TB24: 50.0000 mg | ORAL_TABLET | Freq: Every day | ORAL | Status: DC

## 2015-06-17 NOTE — Progress Notes (Signed)
Subjective:    Patient ID: Tanya Hicks, female    DOB: 1985-03-20, 30 y.o.   MRN: [id number]  DOS:  06/17/2015 Type of visit - description : CPX Interval history:  Feeling well, no major concerns, would like to change her BP medication to a single dose qd   Review of Systems  Constitutional: No fever. No chills. No unexplained wt changes. No unusual sweats  HEENT: No dental problems, no ear discharge, no facial swelling, no voice changes. No eye discharge, no eye  redness , no  intolerance to light   Respiratory: No wheezing , no  difficulty breathing. No cough , no mucus production  Cardiovascular: No CP, no leg swelling , no  Palpitations  GI: no nausea, no vomiting, no diarrhea , no  abdominal pain.  No blood in the stools. No dysphagia, no odynophagia    Endocrine: No polyphagia, no polyuria , no polydipsia  GU: No dysuria, gross hematuria, difficulty urinating. No urinary urgency, no frequency.  Musculoskeletal: No joint swellings or unusual aches or pains  Skin: No change in the color of the skin, palor , no  Rash  Allergic, immunologic: No environmental allergies , no  food allergies  Neurological: No dizziness no  syncope. No headaches. No diplopia, no slurred, no slurred speech, no motor deficits, no facial  Numbness  Hematological: No enlarged lymph nodes, no easy bruising , no unusual bleedings  Psychiatry: No suicidal ideas, no hallucinations, no beavior problems, no confusion.  No unusual/severe anxiety, no depression  Past Medical History  Diagnosis Date  . Hypertension   . Ovarian cyst   . Kidney stone 01-2014  . Pregnancy induced hypertension   . PROM (premature rupture of membranes) 08/13/2014  . SVD (spontaneous vaginal delivery) 08/14/2014    Past Surgical History  Procedure Laterality Date  . Wisdom tooth extraction      Social History   Social History  . Marital Status: Married    Spouse Name: N/A  . Number of Children: 2  . 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 Smoking status: Never Smoker   . Smokeless tobacco: Never Used  . Alcohol Use: No     Comment: socially  . Drug Use: No  . Sexual Activity: Yes   Other Topics Concern  . 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 Hypertension Father   . Heart attack Neg Hx   . Diabetes Other     GF  . Colon cancer Neg Hx   . Breast cancer Other     GM, dx in her 43s       Medication List       This list is accurate as of: 06/17/15 11:59 PM.  Always use your most recent med list.               fluticasone 50 MCG/ACT nasal spray  Commonly known as:  FLONASE  Place 2 sprays into both nostrils daily.     labetalol 100 MG tablet  Commonly known as:  NORMODYNE  Take 1 tablet (100 mg total) by mouth 2 (two) times daily.     metoprolol succinate 50 MG 24 hr tablet  Commonly known as:  TOPROL-XL  Take 1 tablet (50 mg total) by mouth  daily. Take with or immediately following a meal.     prenatal multivitamin Tabs tablet  Take 1 tablet by mouth at bedtime.           Objective:   Physical Exam BP 120/68 mmHg  Pulse 67  Temp(Src) 98 F (36.7 C) (Oral)  Ht 5' 3.5" (1.613 m)  Wt 122 lb 6.4 oz (55.52 kg)  BMI 21.34 kg/m2  SpO2 99%  LMP 11/09/2013  Breastfeeding? Yes  General:   Well developed, well nourished . NAD.  Neck: No  Thyromegaly  HEENT:  Normocephalic . Face symmetric, atraumatic Lungs:  CTA B Normal respiratory effort, no intercostal retractions, no accessory muscle use. Heart: RRR,  no murmur.  No pretibial edema bilaterally  Abdomen:  Not distended, soft, non-tender. No rebound or rigidity.   Skin: Exposed areas without rash. Not pale. Not jaundice Neurologic:  alert & oriented X3.  Speech normal, gait appropriate for age and unassisted Strength symmetric and  appropriate for age.  Psych: Cognition and judgment appear intact.  Cooperative with normal attention span and concentration.  Behavior appropriate. No anxious or depressed appearing.    Assessment & Plan:   Assessment> HTN Ovarian cyst Kidney stone 01/2014 Husband had a vasectomy 12-2014  Plan: HTN: Well control with labetalol but likes  to change to once daily formulation >> once she is done breast-feeding will switch to metoprolol ER 50 mg one tablet daily, call if BP not well control for dose adjustment RTC one year

## 2015-06-17 NOTE — Patient Instructions (Signed)
GO TO THE LAB :      Get the blood work     GO TO THE FRONT DESK  Schedule your next appointment for a  general checkup in one year, fasting    Check the  blood pressure 2 or 3 times a month   Be sure your blood pressure is between 110/65 and  135/85. If it is consistently higher or lower, let me know   Once you finish breast-feeding, change from labetalol to metoprolol once daily. Call if your BP is not well-controlled.

## 2015-06-17 NOTE — Assessment & Plan Note (Addendum)
Immunizations at [facility]   Gyn care Dr Wynn Maudlin Room for improvement on diet and exercise. Labs: BMP, FLP, CBC. RTC one year

## 2015-06-17 NOTE — Progress Notes (Signed)
Pre visit review using our clinic review tool, if applicable. No additional management support is needed unless otherwise documented below in the visit note. 

## 2015-06-18 NOTE — Assessment & Plan Note (Signed)
HTN: Well control with labetalol but likes  to change to once daily formulation >> once she is done breast-feeding will switch to metoprolol ER 50 mg one tablet daily, call if BP not well control for dose adjustment RTC one year

## 2015-08-19 MED FILL — FLUTICASONE PROP 50 MCG SPR: 50 | 30 days supply | Qty: 16 | Fill #0

## 2015-08-19 MED FILL — METOPROLOL SUCC ER 50 MG TA: 50 | 90 days supply | Qty: 90 | Fill #0

## 2015-11-30 ENCOUNTER — Telehealth (HOSPITAL_COMMUNITY): Payer: Self-pay | Admitting: Lactation Services

## 2015-11-30 NOTE — Telephone Encounter (Signed)
Mom called concerned about seeing blood in her milk when she pumped this morning. Tanya Hicks is now 8 months old and mom is still pumping and giving him EBM. Had plugged duct about 2 [name] ago that resolved in a day or two. Had another one in same area 1 week later. It also resolved in a day or so. This morning she noticed blood in her milk. Has Blelb on nipple. Encouraged to observe when pumping and see if blood comes from nipple. Asking about mastitis- reviewed symptoms and she has none of those. Encouraged to call OB. No further questions at present.

## 2015-12-03 MED FILL — METOPROLOL SUCC ER 50 MG TA: 50 | 90 days supply | Qty: 90 | Fill #1

## 2016-02-29 MED FILL — METOPROLOL SUCC ER 50 MG TA: 50 | 90 days supply | Qty: 90 | Fill #2

## 2016-04-06 DIAGNOSIS — H5213 Myopia, bilateral: Secondary | ICD-10-CM | POA: Diagnosis not present

## 2016-05-27 MED FILL — METOPROLOL SUCC ER 50 MG TA: 50 | 90 days supply | Qty: 90 | Fill #3

## 2016-05-31 DIAGNOSIS — Z01419 Encounter for gynecological examination (general) (routine) without abnormal findings: Secondary | ICD-10-CM | POA: Diagnosis not present

## 2016-05-31 DIAGNOSIS — Z130 Encounter for screening for diseases of the blood and blood-forming organs and certain disorders involving the immune mechanism: Secondary | ICD-10-CM | POA: Diagnosis not present

## 2016-05-31 DIAGNOSIS — Z6823 Body mass index (BMI) 23.0-23.9, adult: Secondary | ICD-10-CM | POA: Diagnosis not present

## 2016-05-31 DIAGNOSIS — Z1389 Encounter for screening for other disorder: Secondary | ICD-10-CM | POA: Diagnosis not present

## 2016-09-12 ENCOUNTER — Other Ambulatory Visit: Payer: Self-pay | Admitting: Internal Medicine

## 2016-09-12 MED FILL — METOPROLOL SUCC ER 50 MG TA: 50 | 30 days supply | Qty: 30 | Fill #0

## 2016-09-26 ENCOUNTER — Encounter: Payer: Self-pay | Admitting: Internal Medicine

## 2016-09-26 ENCOUNTER — Ambulatory Visit (INDEPENDENT_AMBULATORY_CARE_PROVIDER_SITE_OTHER): Payer: 59 | Admitting: Internal Medicine

## 2016-09-26 VITALS — BP 106/64 | HR 66 | Temp 97.8°F | Resp 14 | Ht 64.0 in | Wt 139.2 lb

## 2016-09-26 DIAGNOSIS — Z0000 Encounter for general adult medical examination without abnormal findings: Secondary | ICD-10-CM | POA: Diagnosis not present

## 2016-09-26 MED ORDER — METOPROLOL SUCCINATE ER 50 MG PO TB24: 50.0000 mg | ORAL_TABLET | Freq: Every day | ORAL | 3 refills | Status: DC

## 2016-09-26 NOTE — Patient Instructions (Signed)
GO TO THE LAB : Get the blood work     GO TO THE FRONT DESK Schedule your next appointment for a   physical exam in one year   Check the  blood pressure  weekly   Be sure your blood pressure is between 110/65 and  145/85. If it is consistently higher or lower, let me know

## 2016-09-26 NOTE — Progress Notes (Signed)
Pre visit review using our clinic review tool, if applicable. No additional management support is needed unless otherwise documented below in the visit note. 

## 2016-09-26 NOTE — Assessment & Plan Note (Addendum)
-  Immunizations at [facility] care Dr Wynn Maudlin - Diet: Room for improvement, plans to start a healthier diet. Active but not much time to actually exercise. -Labs: FLP, CBC, TSH, CMP. RTC one year

## 2016-09-26 NOTE — Progress Notes (Signed)
Subjective:    Patient ID: Tanya Hicks, female    DOB: 08-May-1985, 31 y.o.   MRN: [id number]  DOS:  09/26/2016 Type of visit - description : cpx Interval history: Good compliance with medication. Ambulatory BPs when she is active  at work in the 140s/90. But lower at any other time   Review of Systems Reports occasional pain at the thumbs, left hip and left great toe. Denies any swelling, redness or warmness   Other than above, a 14 point review of systems is negative     Past Medical History:  Diagnosis Date  . Hypertension   . Kidney stone 01-2014  . Ovarian cyst   . Pregnancy induced hypertension   . PROM (premature rupture of membranes) 08/13/2014  . SVD (spontaneous vaginal delivery) 08/14/2014    Past Surgical History:  Procedure Laterality Date  . WISDOM TOOTH EXTRACTION      Social History   Social History  . Marital status: Married    Spouse name: N/A  . Number of children: 2  . 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 Smoking status: Never Smoker  . Smokeless tobacco: Never Used  . Alcohol use No     Comment: socially  . Drug use: No  . Sexual activity: Yes   Other Topics Concern  . 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 Hypertension Father   . Diabetes Other        GF  . Breast cancer Other        GM, dx in her 63s  . Heart attack Neg Hx   . Colon cancer Neg Hx      Allergies as of 09/26/2016   No Known Allergies     Medication List       Accurate as of 09/26/16 11:59 PM. Always use your most recent med list.          fluticasone 50 MCG/ACT nasal spray Commonly known as:  FLONASE Place 2 sprays into both nostrils daily.   metoprolol succinate 50 MG 24 hr tablet Commonly known as:  TOPROL-XL Take 1 tablet (50 mg total) by mouth daily. Take with or immediately  following a meal   prenatal multivitamin Tabs tablet Take 1 tablet by mouth at bedtime.          Objective:   Physical Exam BP 106/64 (BP Location: Left Arm, Patient Position: Sitting, Cuff Size: Small)   Pulse 66   Temp 97.8 F (36.6 C) (Oral)   Resp 14   Ht 5\' 4"  (1.626 m)   Wt 139 lb 4 oz (63.2 kg)   LMP 09/04/2016 (Approximate)   SpO2 97%   Breastfeeding? No   BMI 23.90 kg/m   General:   Well developed, well nourished . NAD.  Neck: Barely palpable thyroid gland, not tender, nodular or hard.  HEENT:  Normocephalic . Face symmetric, atraumatic Lungs:  CTA B Normal respiratory effort, no intercostal retractions, no accessory muscle use. Heart: RRR,  no murmur.  No pretibial edema bilaterally  Abdomen:  Not distended, soft, non-tender. No rebound or rigidity.   MSK: Hands and wrists without synovitis. Toes normal to inspection and palpation. Skin: Exposed areas without rash. Not pale. Not jaundice Neurologic:  alert & oriented X3.  Speech normal, gait appropriate for age and unassisted Strength symmetric and appropriate for age.  Psych: Cognition and judgment appear intact.  Cooperative with normal attention span and concentration.  Behavior appropriate. No anxious or depressed appearing.    Assessment & Plan:   Assessment> HTN Ovarian cyst Kidney stone 01/2014 Birth control: Husband had a vasectomy   Plan: HTN: Currently on metoprolol, well-controlled, from time to time BPs elevated in the 140/90 when she is active at work. Today's 106/64, actually slightly low, she is asx. Recommend to continue present care and monitor BPs. Joint pain: As described above, exam is normal, Rx observation . Palpable thyroid gland: Recheck yearly. RTC one year

## 2016-09-27 LAB — COMPREHENSIVE METABOLIC PANEL
ALK PHOS: 47 U/L (ref 39–117)
ALT: 18 U/L (ref 0–35)
AST: 20 U/L (ref 0–37)
Albumin: 4.7 g/dL (ref 3.5–5.2)
BUN: 13 mg/dL (ref 6–23)
CALCIUM: 9.2 mg/dL (ref 8.4–10.5)
CO2: 26 mEq/L (ref 19–32)
Chloride: 102 mEq/L (ref 96–112)
Creatinine, Ser: 0.64 mg/dL (ref 0.40–1.20)
GFR: 113.61 mL/min (ref >60.00)
Glucose, Bld: 71 mg/dL (ref 70–99)
POTASSIUM: 3.6 meq/L (ref 3.5–5.1)
Sodium: 136 mEq/L (ref 135–145)
TOTAL PROTEIN: 6.4 g/dL (ref 6.0–8.3)
Total Bilirubin: 1.3 mg/dL — ABNORMAL HIGH (ref 0.2–1.2)

## 2016-09-27 LAB — LIPID PANEL
Cholesterol: 129 mg/dL (ref 0–200)
HDL: 57.8 mg/dL (ref >39.00)
LDL Cholesterol: 63 mg/dL (ref 0–99)
NonHDL: 71.62
Total CHOL/HDL Ratio: 2
Triglycerides: 42 mg/dL (ref 0.0–149.0)
VLDL: 8.4 mg/dL (ref 0.0–40.0)

## 2016-09-27 LAB — CBC WITH DIFFERENTIAL/PLATELET
Basophils Absolute: 0 10*3/uL (ref 0.0–0.1)
Basophils Relative: 0.5 % (ref 0.0–3.0)
EOS ABS: 0.1 10*3/uL (ref 0.0–0.7)
Eosinophils Relative: 2 % (ref 0.0–5.0)
HCT: 41.4 % (ref 36.0–46.0)
Hemoglobin: 14.1 g/dL (ref 12.0–15.0)
LYMPHS PCT: 26.6 % (ref 12.0–46.0)
Lymphs Abs: 1.8 10*3/uL (ref 0.7–4.0)
MCHC: 34 g/dL (ref 30.0–36.0)
MCV: 92.2 fl (ref 78.0–100.0)
Monocytes Absolute: 0.4 10*3/uL (ref 0.1–1.0)
Monocytes Relative: 5.8 % (ref 3.0–12.0)
NEUTROS ABS: 4.4 10*3/uL (ref 1.4–7.7)
NEUTROS PCT: 65.1 % (ref 43.0–77.0)
PLATELETS: 253 10*3/uL (ref 150.0–400.0)
RBC: 4.49 Mil/uL (ref 3.87–5.11)
RDW: 12.2 % (ref 11.5–15.5)
WBC: 6.7 10*3/uL (ref 4.0–10.5)

## 2016-09-27 LAB — TSH: TSH: 1.14 u[IU]/mL (ref 0.35–4.50)

## 2016-09-27 NOTE — Assessment & Plan Note (Signed)
HTN: Currently on metoprolol, well-controlled, from time to time BPs elevated in the 140/90 when she is active at work. Today's 106/64, actually slightly low, she is asx. Recommend to continue present care and monitor BPs. Joint pain: As described above, exam is normal, Rx observation . Palpable thyroid gland: Recheck yearly. RTC one year

## 2016-10-19 MED FILL — METOPROLOL SUCC ER 50 MG TA: 50 | 90 days supply | Qty: 90 | Fill #0

## 2016-11-04 ENCOUNTER — Other Ambulatory Visit (INDEPENDENT_AMBULATORY_CARE_PROVIDER_SITE_OTHER): Payer: Self-pay | Admitting: Orthopaedic Surgery

## 2016-11-04 MED ORDER — PREDNISONE 10 MG (21) PO TBPK: ORAL_TABLET | ORAL | 0 refills | Status: DC

## 2016-11-04 MED FILL — predniSONE 10 MG TABS: 10 | 6 days supply | Qty: 21 | Fill #0

## 2016-11-10 ENCOUNTER — Ambulatory Visit (INDEPENDENT_AMBULATORY_CARE_PROVIDER_SITE_OTHER): Payer: 59

## 2016-11-10 ENCOUNTER — Ambulatory Visit (INDEPENDENT_AMBULATORY_CARE_PROVIDER_SITE_OTHER): Payer: 59 | Admitting: Orthopaedic Surgery

## 2016-11-10 DIAGNOSIS — R102 Pelvic and perineal pain: Secondary | ICD-10-CM

## 2016-11-10 NOTE — Progress Notes (Addendum)
   Office Visit Note   Patient: Tanya Hicks           Date of Birth: 1985/11/28           MRN: [id number] Visit Date: 11/10/2016              Requested by: Colon Branch, [address] [place],  [id number] PCP: Colon Branch, MD   Assessment & Plan: Visit Diagnoses:  1. Pelvic pain     Plan: MRI pelvis ordered to rule out structural abnormality. She may have a gynecologic etiology related to her pain.  Follow-Up Instructions: Return if symptoms worsen or fail to improve.   Orders:  Orders Placed This Encounter  Procedures  . XR Pelvis 1-2 Views   No orders of the defined types were placed in this encounter.     Procedures: No procedures performed   Clinical Data: No additional findings.   Subjective: No chief complaint on file.   Patient is 31 year old female comes in with persistent anterior pelvic pain with occasional radiation of numbness into her inner thighs. she denies any injuries. This is been going on for over 6 weeks. She has failed 2 rounds of prednisone. She would have occasional groin pain. Denies any changes in menstruation.    Review of Systems   Objective: Vital Signs: There were no vitals taken for this visit.  Physical Exam  Ortho Exam Pelvic exam and hip exam are relatively benign. She has some mild discomfort with palpation over the anterior pelvic brim. There is no masses or lesions. Specialty Comments:  No specialty comments available.  Imaging: Xr Pelvis 1-2 Views  Result Date: 11/10/2016 No acute or structural abnormalities    PMFS History: Patient Active Problem List   Diagnosis Date Noted  . Pelvic pain 11/10/2016  . PCP NOTES >>>>> 01/14/2015  . SVD (spontaneous vaginal delivery) 08/14/2014  . PROM (premature rupture of membranes) 08/13/2014  . Annual physical exam 11/16/2012  . HYPERTENSION 12/29/2006   Past Medical History:  Diagnosis Date  . Hypertension   . Kidney stone 01-2014  . Ovarian  cyst   . Pregnancy induced hypertension   . PROM (premature rupture of membranes) 08/13/2014  . SVD (spontaneous vaginal delivery) 08/14/2014    Family History  Problem Relation Age of Onset  . Hypertension Father   . Diabetes Other        GF  . Breast cancer Other        GM, dx in her 33s  . Heart attack Neg Hx   . Colon cancer Neg Hx     Past Surgical History:  Procedure Laterality Date  . 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 Smoking status: Never Smoker  . Smokeless tobacco: Never Used  . Alcohol use No     Comment: socially  . Drug use: No  . Sexual activity: Yes

## 2016-11-10 NOTE — Addendum Note (Signed)
Addended by: Precious Bard on: 11/10/2016 11:51 AM   Modules accepted: Orders

## 2016-11-11 DIAGNOSIS — R102 Pelvic and perineal pain: Secondary | ICD-10-CM | POA: Diagnosis not present

## 2016-11-11 DIAGNOSIS — R300 Dysuria: Secondary | ICD-10-CM | POA: Diagnosis not present

## 2016-11-14 ENCOUNTER — Ambulatory Visit (INDEPENDENT_AMBULATORY_CARE_PROVIDER_SITE_OTHER): Payer: Self-pay | Admitting: Orthopaedic Surgery

## 2016-11-14 DIAGNOSIS — D251 Intramural leiomyoma of uterus: Secondary | ICD-10-CM | POA: Diagnosis not present

## 2016-11-14 DIAGNOSIS — R109 Unspecified abdominal pain: Secondary | ICD-10-CM | POA: Diagnosis not present

## 2016-11-20 ENCOUNTER — Inpatient Hospital Stay: Admission: RE | Admit: 2016-11-20 | Payer: 59 | Source: Ambulatory Visit

## 2017-01-18 MED FILL — METOPROLOL SUCC ER 50 MG TA: 50 | 90 days supply | Qty: 90 | Fill #1

## 2017-04-27 MED FILL — METOPROLOL SUCCINATE ER 50: 50 | 90 days supply | Qty: 90 | Fill #2

## 2017-05-05 ENCOUNTER — Ambulatory Visit: Payer: Self-pay

## 2017-05-05 NOTE — Telephone Encounter (Signed)
Pt calling with elevated BP 152/102 and had pt repeat BP at work :156/92. Pt only complaint is non radiating, mild constant chest "discomfort." Pt states that she has noticed it for the past week and a half. Pt is currently at work. She takes Toprol XL and denies missing any doses. Denies weakness, numbness on one side of the face,arm,leg or body.  Care advice given and appt made for Tuesday (per pt request due to work Monday) with PCP. Asked pt if I could try and get in in with another provider today and she refused and wants to see her PCP. Routing call back to office Reason for Disposition . [0] Systolic BP  >= 569 OR Diastolic >= 90 AND [7] taking BP medications  Answer Assessment - Initial Assessment Questions 1. BLOOD PRESSURE: "What is the blood pressure?" "Did you take at least two measurements 5 minutes apart?"     152/102 on Toprol XL yesterday 135/90 2. ONSET: "When did you take your blood pressure?"     This am at 1015  3. HOW: "How did you obtain the blood pressure?" (e.g., visiting nurse, automatic home BP monitor)     Automatic BP machine at hospital 4. HISTORY: "Do you have a history of high blood pressure?"     yes 5. MEDICATIONS: "Are you taking any medications for blood pressure?" "Have you missed any doses recently?"     Toprol XL- no doses missed recently 6. OTHER SYMPTOMS: "Do you have any symptoms?" (e.g., headache, chest pain, blurred vision, difficulty breathing, weakness)     Chest "discomfort" no radiation. Located midchest-constant 7. PREGNANCY: "Is there any chance you are pregnant?" "When was your last menstrual period?"     No- LMP: 04/30/17  Protocols used: HIGH BLOOD PRESSURE-A-AH

## 2017-05-05 NOTE — Telephone Encounter (Signed)
I advised the patient to keep the appointment for next week however if he has she has severe symptoms, pain is associated with nausea, diaphoresis, radiate to the arm: Needs to be seen immediately.  Please let her know.

## 2017-05-05 NOTE — Telephone Encounter (Signed)
Please advise if okay to keep appt for Tuesday.

## 2017-05-09 ENCOUNTER — Ambulatory Visit (HOSPITAL_BASED_OUTPATIENT_CLINIC_OR_DEPARTMENT_OTHER)
Admission: RE | Admit: 2017-05-09 | Discharge: 2017-05-09 | Disposition: A | Discharge status: Home / Self Care | Payer: 59 | Source: Ambulatory Visit | Attending: Internal Medicine | Admitting: Internal Medicine

## 2017-05-09 ENCOUNTER — Encounter: Payer: Self-pay | Admitting: Internal Medicine

## 2017-05-09 ENCOUNTER — Ambulatory Visit (INDEPENDENT_AMBULATORY_CARE_PROVIDER_SITE_OTHER): Payer: 59 | Admitting: Internal Medicine

## 2017-05-09 VITALS — BP 142/90 | HR 82 | Temp 98.0°F | Resp 14 | Ht 64.0 in | Wt 135.5 lb

## 2017-05-09 DIAGNOSIS — R079 Chest pain, unspecified: Secondary | ICD-10-CM | POA: Diagnosis not present

## 2017-05-09 DIAGNOSIS — I10 Essential (primary) hypertension: Secondary | ICD-10-CM

## 2017-05-09 MED ORDER — METOPROLOL SUCCINATE ER 100 MG PO TB24: 100.0000 mg | ORAL_TABLET | Freq: Every day | ORAL | 6 refills | Status: DC

## 2017-05-09 NOTE — Progress Notes (Signed)
Subjective:    Patient ID: Tanya Hicks, female    DOB: Nov 29, 1985, 32 y.o.   MRN: [id number]  DOS:  05/09/2017 Type of visit - description : acute Interval history: Here because of chest pain and elevated BP. The patient started to take a OTC nutrition supplement for weight loss approximately 2 months ago, it did work, she has lost 10 pounds. About 2 weeks ago started to have chest pain, located anteriorly in the center of the chest, the pain is minimal but is steady and from time to time it increases in intensity however there is no clear exacerbating factors.  Does not change with food or exertion. It radiated to the left shoulder only yesterday.  Because of chest pain, she started to check her blood pressure a week ago and he has been ranging from 130/90 to 150/90.   Review of Systems Denies fever chills No cough. No difficulty breathing.  No recent airplane trip or prolonged car trip. Denies palpitations.  No lower extremity edema Stress at baseline. No heartburn or indigestion type symptoms Past Medical History:  Diagnosis Date  . Hypertension   . Kidney stone 01-2014  . Ovarian cyst   . Pregnancy induced hypertension   . PROM (premature rupture of membranes) 08/13/2014  . SVD (spontaneous vaginal delivery) 08/14/2014    Past Surgical History:  Procedure Laterality Date  . WISDOM TOOTH EXTRACTION      Social History   Socioeconomic History  . Marital status: Married    Spouse name: Not on file  . Number of children: 2  . Years of education: Not on file  . Highest education level: Not on file  Occupational History  . Occupation: Psychologist, forensic: [employer]  . Financial resource strain: Not on file  . Food insecurity:    Worry: Not on file    Inability: Not on file  . Transportation needs:    Medical: Not on file    Non-medical: Not on file  Tobacco Use  . Smoking status: Never Smoker  . Smokeless tobacco: Never Used    Substance and Sexual Activity  . Alcohol use: No    Comment: socially  . Drug use: No  . Sexual activity: Yes  Lifestyle  . Physical activity:    Days per week: Not on file    Minutes per session: Not on file  . Stress: Not on file  Relationships  . Social connections:    Talks on phone: Not on file    Gets together: Not on file    Attends religious service: Not on file    Active member of club or organization: Not on file    Attends meetings of clubs or organizations: Not on file    Relationship status: Not on file  . Intimate partner violence:    Fear of current or ex partner: Not on file    Emotionally abused: Not on file    Physically abused: Not on file    Forced sexual activity: Not on file  Other Topics Concern  . 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 Always use your most recent med list.          metoprolol succinate  100 MG 24 hr tablet Commonly known as:  TOPROL-XL Take 1 tablet (100 mg total) by mouth daily. Take with or immediately following a meal.          Objective:   Physical Exam BP (!) 142/90 (BP Location: Left Arm, Patient Position: Sitting, Cuff Size: Small)   Pulse 82   Temp 98 F (36.7 C) (Oral)   Resp 14   Ht 5\' 4"  (1.626 m)   Wt 135 lb 8 oz (61.5 kg)   LMP 04/30/2017 (Exact Date)   SpO2 98%   BMI 23.26 kg/m  General:   Well developed, well nourished . NAD.  HEENT:  Normocephalic . Face symmetric, atraumatic Lungs:  CTA B Normal respiratory effort, no intercostal retractions, no accessory muscle use. Heart: RRR,  no murmur.  no pretibial edema bilaterally  Abdomen:  Not distended, soft, non-tender. No rebound or rigidity.   Skin: Not pale. Not jaundice Neurologic:  alert & oriented X3.  Speech normal, gait appropriate for age and unassisted Psych--  Cognition and  judgment appear intact.  Cooperative with normal attention span and concentration.  Behavior appropriate. No anxious or depressed appearing.     Assessment & Plan:  Assessment> HTN Ovarian cyst Kidney stone 01/2014 Birth control: Husband had a vasectomy   Plan: HTN: Currently on metoprolol, BP noted to be elevated in the 130/90 and 150/90 the last week in the context of taking a nutritional supplement to loose weight.  She  stopped the supplement a week ago, BP still slightly elevated.  We agreed to increase metoprolol to 100 mg daily We will check BMP, CBC and TSH Monitor BPs.  See AVS Chest pain: Very atypical, low suspicion for PE, CAD.  Patient is concerned about this issue.  For completeness we will get a chest x-ray and a exercise a stress test. RTC 6 weeks

## 2017-05-09 NOTE — Progress Notes (Signed)
Pre visit review using our clinic review tool, if applicable. No additional management support is needed unless otherwise documented below in the visit note. 

## 2017-05-09 NOTE — Patient Instructions (Signed)
GO TO THE LAB : Get the blood work     GO TO THE FRONT DESK Schedule your next appointment for a checkup in 6 weeks   STOP BY THE FIRST FLOOR:  get the XR   Increase metoprolol to 100 mg daily  Check the  blood pressure 2 or 3 times a   week   Be sure your blood pressure is between 110/65 and  135/85. If it is consistently higher or lower, let me know

## 2017-05-10 LAB — CBC WITH DIFFERENTIAL/PLATELET
Basophils Absolute: 0 10*3/uL (ref 0.0–0.1)
Basophils Relative: 0.8 % (ref 0.0–3.0)
Eosinophils Absolute: 0.1 10*3/uL (ref 0.0–0.7)
Eosinophils Relative: 1.3 % (ref 0.0–5.0)
HCT: 43.7 % (ref 36.0–46.0)
Hemoglobin: 15.1 g/dL — ABNORMAL HIGH (ref 12.0–15.0)
LYMPHS ABS: 1.3 10*3/uL (ref 0.7–4.0)
Lymphocytes Relative: 28 % (ref 12.0–46.0)
MCHC: 34.4 g/dL (ref 30.0–36.0)
MCV: 92.2 fl (ref 78.0–100.0)
MONOS PCT: 5.1 % (ref 3.0–12.0)
Monocytes Absolute: 0.2 10*3/uL (ref 0.1–1.0)
NEUTROS PCT: 64.8 % (ref 43.0–77.0)
Neutro Abs: 3 10*3/uL (ref 1.4–7.7)
Platelets: 279 10*3/uL (ref 150.0–400.0)
RBC: 4.74 Mil/uL (ref 3.87–5.11)
RDW: 13 % (ref 11.5–15.5)
WBC: 4.7 10*3/uL (ref 4.0–10.5)

## 2017-05-10 LAB — BASIC METABOLIC PANEL
BUN: 15 mg/dL (ref 6–23)
CO2: 28 meq/L (ref 19–32)
Calcium: 9.7 mg/dL (ref 8.4–10.5)
Chloride: 103 mEq/L (ref 96–112)
Creatinine, Ser: 0.59 mg/dL (ref 0.40–1.20)
GFR: 124.33 mL/min (ref >60.00)
GLUCOSE: 94 mg/dL (ref 70–99)
POTASSIUM: 4.1 meq/L (ref 3.5–5.1)
SODIUM: 140 meq/L (ref 135–145)

## 2017-05-10 LAB — TSH: TSH: 1.31 u[IU]/mL (ref 0.35–4.50)

## 2017-05-10 NOTE — Assessment & Plan Note (Signed)
HTN: Currently on metoprolol, BP noted to be elevated in the 130/90 and 150/90 the last week in the context of taking a nutritional supplement to loose weight.  She  stopped the supplement a week ago, BP still slightly elevated.  We agreed to increase metoprolol to 100 mg daily We will check BMP, CBC and TSH Monitor BPs.  See AVS Chest pain: Very atypical, low suspicion for PE, CAD.  Patient is concerned about this issue.  For completeness we will get a chest x-ray and a exercise a stress test. RTC 6 weeks

## 2017-05-17 ENCOUNTER — Ambulatory Visit (INDEPENDENT_AMBULATORY_CARE_PROVIDER_SITE_OTHER): Payer: 59

## 2017-05-17 DIAGNOSIS — R079 Chest pain, unspecified: Secondary | ICD-10-CM | POA: Diagnosis not present

## 2017-05-17 LAB — EXERCISE TOLERANCE TEST
CHL RATE OF PERCEIVED EXERTION: 16
CSEPED: 10 min
CSEPEW: 12.4 METS
CSEPHR: 95 %
Exercise duration (sec): 24 s
MPHR: 187 {beats}/min
Peak HR: 179 {beats}/min
Rest HR: 82 {beats}/min

## 2017-06-14 DIAGNOSIS — H5213 Myopia, bilateral: Secondary | ICD-10-CM | POA: Diagnosis not present

## 2017-06-20 MED FILL — METOPROLOL SUCCINATE ER 100: 100 | 30 days supply | Qty: 30 | Fill #0

## 2017-06-21 ENCOUNTER — Ambulatory Visit: Payer: 59 | Admitting: Internal Medicine

## 2017-07-14 DIAGNOSIS — Z124 Encounter for screening for malignant neoplasm of cervix: Secondary | ICD-10-CM | POA: Diagnosis not present

## 2017-07-14 DIAGNOSIS — Z6824 Body mass index (BMI) 24.0-24.9, adult: Secondary | ICD-10-CM | POA: Diagnosis not present

## 2017-07-14 DIAGNOSIS — Z1389 Encounter for screening for other disorder: Secondary | ICD-10-CM | POA: Diagnosis not present

## 2017-07-14 DIAGNOSIS — Z01419 Encounter for gynecological examination (general) (routine) without abnormal findings: Secondary | ICD-10-CM | POA: Diagnosis not present

## 2017-07-24 MED FILL — METOPROLOL SUCCINATE ER 50: 50 | 90 days supply | Qty: 90 | Fill #3

## 2017-09-16 IMAGING — DX DG CHEST 2V
2 series · 2 of 2 positions shown · non-contrast
Comparison: PA and lateral chest x-ray December 28, 2007

CLINICAL DATA: Cough, chest congestion, right lateral chest pain
for the past 2 weeks, history of hypertension.

EXAM:
CHEST  2 VIEW

[chest pa]
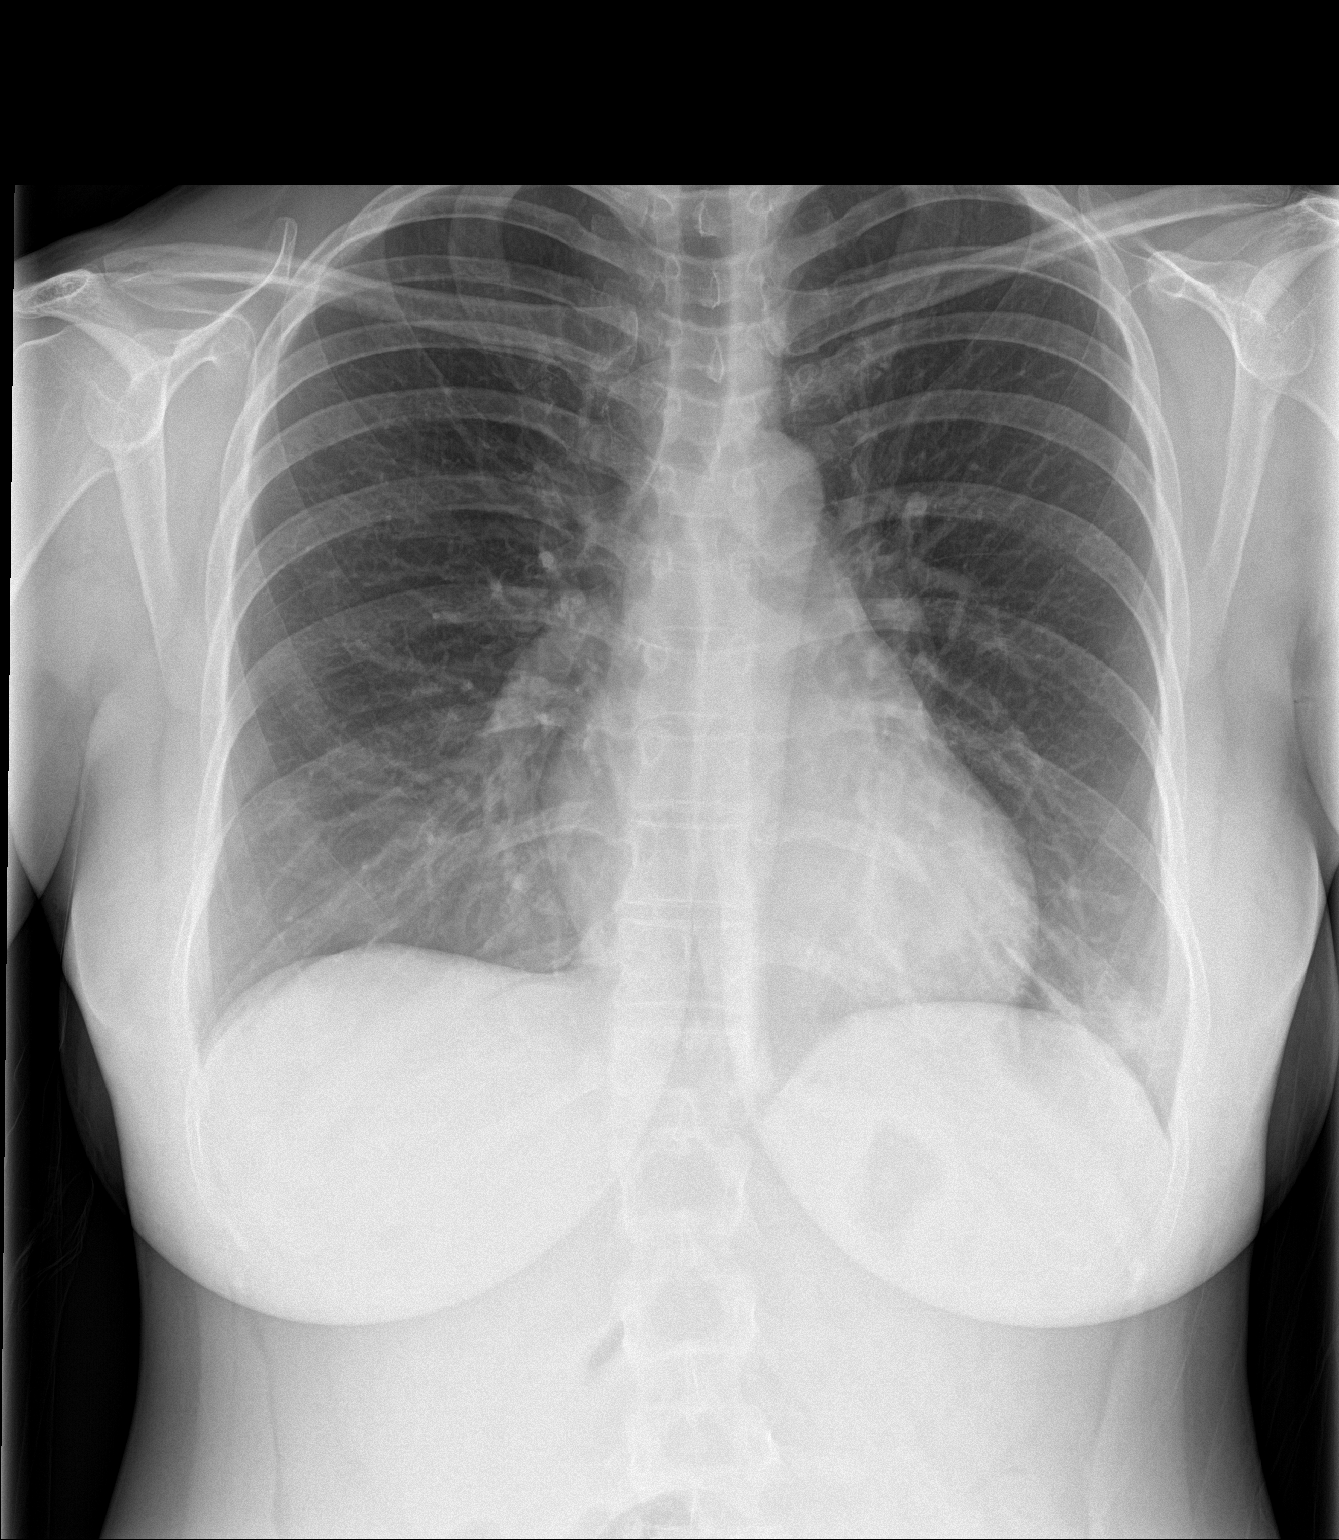

[chest lat]
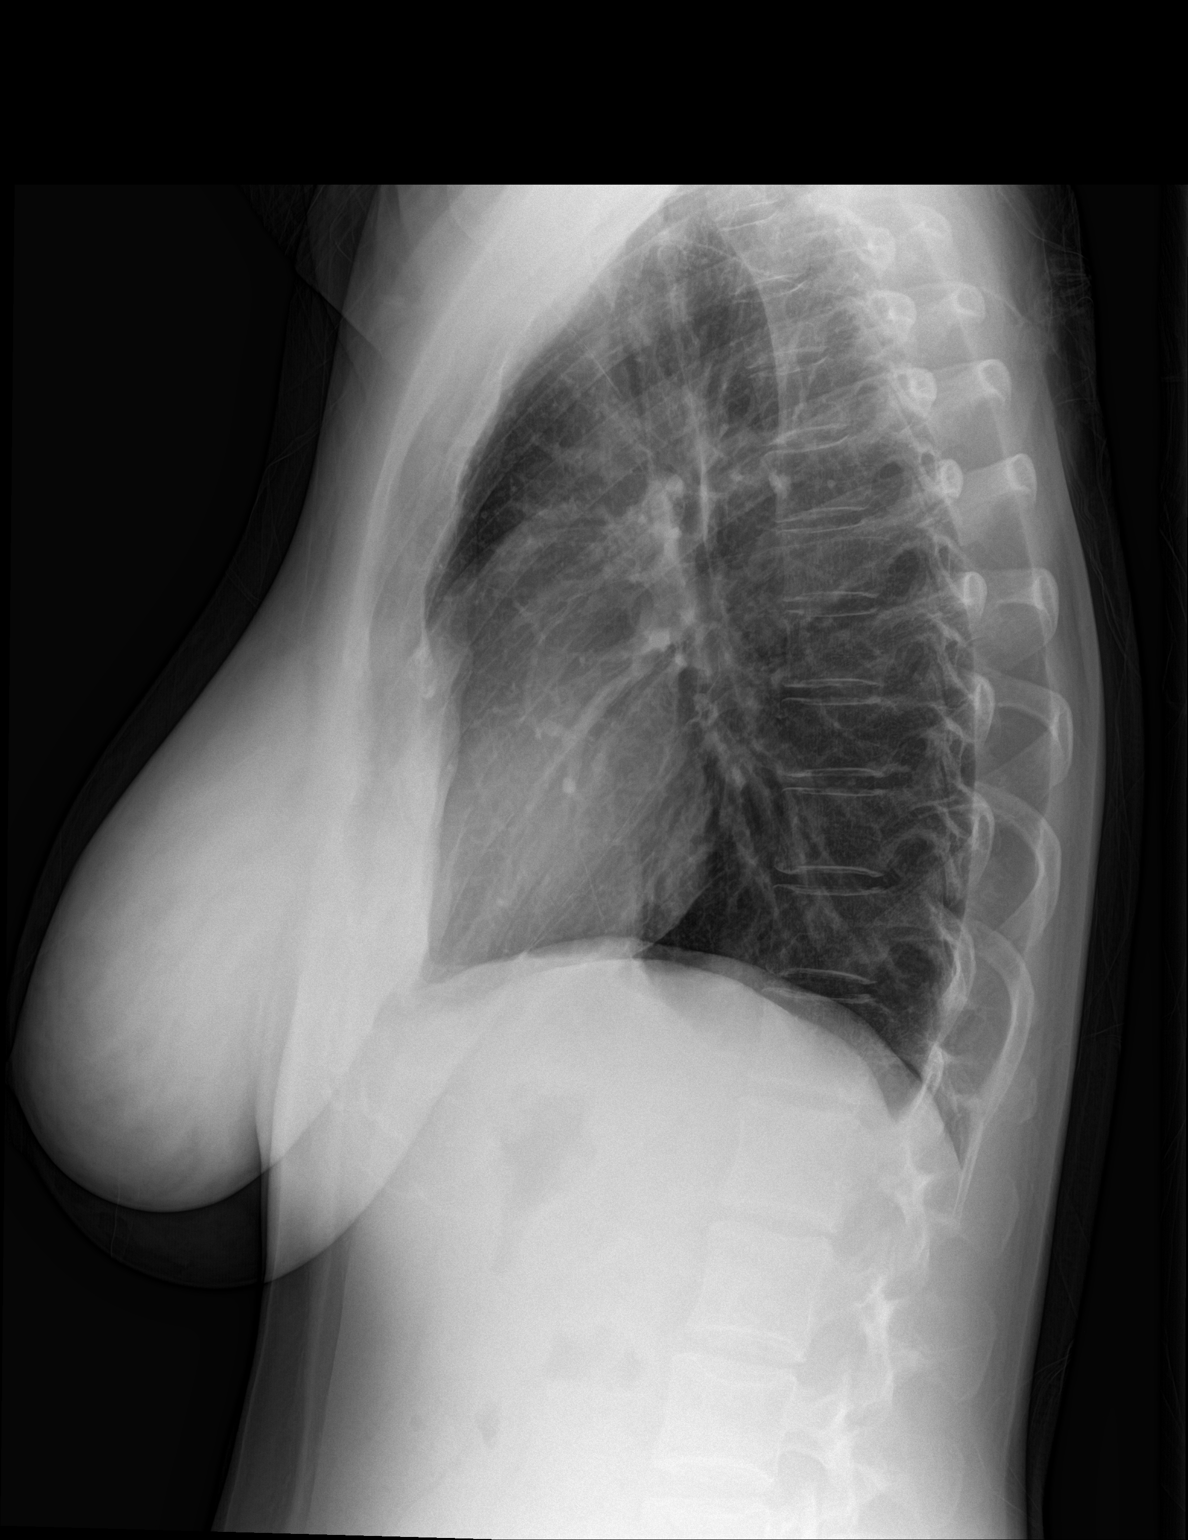

[2 of 2 positions shown; findings below may reference images not displayed]

FINDINGS: The lungs are well-expanded. There is subtle increased density just
above the left lateral costophrenic gutter on the frontal image the
that is not clearly evident on the lateral view. This may reflect an
early lingular infiltrate or area of atelectasis. The heart and
pulmonary vascularity are normal. The mediastinum is normal in
width. There is no pleural effusion. The bony thorax is
unremarkable.
IMPRESSION: Subtle increased density just above the left hemidiaphragm could
reflect early infiltrate or atelectasis. No abnormality is noted in
the right lung. The right ribcage is unremarkable.

Follow-up radiographs following anticipated antibiotic therapy are
recommended to assure clearing.

## 2017-09-27 ENCOUNTER — Encounter: Payer: 59 | Admitting: Internal Medicine

## 2017-10-10 ENCOUNTER — Encounter: Payer: Self-pay | Admitting: Internal Medicine

## 2017-10-10 ENCOUNTER — Ambulatory Visit (INDEPENDENT_AMBULATORY_CARE_PROVIDER_SITE_OTHER): Payer: 59 | Admitting: Internal Medicine

## 2017-10-10 VITALS — BP 124/78 | HR 76 | Temp 98.6°F | Resp 16 | Ht 63.75 in | Wt 144.6 lb

## 2017-10-10 DIAGNOSIS — Z0000 Encounter for general adult medical examination without abnormal findings: Secondary | ICD-10-CM | POA: Diagnosis not present

## 2017-10-10 LAB — LIPID PANEL
CHOL/HDL RATIO: 3
Cholesterol: 185 mg/dL (ref 0–200)
HDL: 62 mg/dL (ref >39.00)
LDL CALC: 92 mg/dL (ref 0–99)
NonHDL: 122.85
TRIGLYCERIDES: 155 mg/dL — AB (ref 0.0–149.0)
VLDL: 31 mg/dL (ref 0.0–40.0)

## 2017-10-10 LAB — HEMOGLOBIN A1C: Hgb A1c MFr Bld: 4.7 % (ref 4.6–6.5)

## 2017-10-10 NOTE — Assessment & Plan Note (Addendum)
-  Immunizations at [facility]  ; Td 2016 -Gyn care per gyn - Diet and exercise excellent.  Patient is somewhat concerned because she is not losing weight, BMI is 25, encouraged to continue her healthy lifestyle maintaining her weight. -Labs: CMP, A1c.

## 2017-10-10 NOTE — Progress Notes (Signed)
Subjective:    Patient ID: Tanya Hicks, female    DOB: 06/19/1985, 32 y.o.   MRN: [id number]  DOS:  10/10/2017 Type of visit - description : Physical exam Interval history: Since the last office visit she is doing well, no further chest pain. Good compliance with medication, she does check BPs in the ambulatory setting   Review of Systems  A 14 point review of systems is negative   Past Medical History:  Diagnosis Date  . Hypertension   . Kidney stone 01-2014  . Ovarian cyst   . Pregnancy induced hypertension   . PROM (premature rupture of membranes) 08/13/2014  . SVD (spontaneous vaginal delivery) 08/14/2014    Past Surgical History:  Procedure Laterality Date  . WISDOM TOOTH EXTRACTION      Social History   Socioeconomic History  . Marital status: Married    Spouse name: Not on file  . Number of children: 2  . Years of education: Not on file  . Highest education level: Not on file  Occupational History  . Occupation: Psychologist, forensic: [employer]  . Financial resource strain: Not on file  . Food insecurity:    Worry: Not on file    Inability: Not on file  . Transportation needs:    Medical: Not on file    Non-medical: Not on file  Tobacco Use  . Smoking status: Never Smoker  . Smokeless tobacco: Never Used  Substance and Sexual Activity  . Alcohol use: No    Comment: socially  . Drug use: No  . Sexual activity: Yes  Lifestyle  . Physical activity:    Days per week: Not on file    Minutes per session: Not on file  . Stress: Not on file  Relationships  . Social connections:    Talks on phone: Not on file    Gets together: Not on file    Attends religious service: Not on file    Active member of club or organization: Not on file    Attends meetings of clubs or organizations: Not on file    Relationship status: Not on file  . Intimate partner violence:    Fear of current or ex partner: Not on file    Emotionally abused:  Not on file    Physically abused: Not on file    Forced sexual activity: Not on file  Other Topics Concern  . 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 Hypertension Father   . Diabetes Other        GF  . Breast cancer Other        GM, dx in her 30s  . Heart attack Neg Hx   . Colon cancer Neg Hx      Allergies as of 10/10/2017   No Known Allergies     Medication List        Accurate as of 10/10/17 11:59 PM. Always use your most recent med list.          metoprolol succinate 100 MG 24 hr tablet Commonly known as:  TOPROL-XL Take 1 tablet (100 mg total) by mouth daily. Take with or immediately following a meal.          Objective:   Physical Exam BP 124/78 (BP Location: Left Arm, Cuff Size: Normal)  Pulse 76   Temp 98.6 F (37 C) (Oral)   Resp 16   Ht 5' 3.75" (1.619 m)   Wt 144 lb 9.6 oz (65.6 kg)   LMP 10/05/2017   SpO2 98%   BMI 25.02 kg/m  General: Well developed, NAD, see BMI.  Neck: No  thyromegaly  HEENT:  Normocephalic . Face symmetric, atraumatic Lungs:  CTA B Normal respiratory effort, no intercostal retractions, no accessory muscle use. Heart: RRR,  no murmur.  No pretibial edema bilaterally  Abdomen:  Not distended, soft, non-tender. No rebound or rigidity.   Skin: Exposed areas without rash. Not pale. Not jaundice Neurologic:  alert & oriented X3.  Speech normal, gait appropriate for age and unassisted Strength symmetric and appropriate for age.  Psych: Cognition and judgment appear intact.  Cooperative with normal attention span and concentration.  Behavior appropriate. No anxious or depressed appearing.     Assessment & Plan:    Assessment> HTN Ovarian cyst Kidney stone 01/2014 Birth control: Husband had a vasectomy  CP: Stress test normal 05/2017  Plan: HTN: On metoprolol XL 100 mg daily, ambulatory BP is 120/78, sometimes when she is  at work, busy, her BP goes to 140/90. For now we agreed to continue with your healthy lifestyle, metoprolol at the same dose and monitor BPs.  If they BPs are more consistently 140/90 or if  elevated at rest will increase metoprolol dose (pulse today 78). CP: resolved, stress test (-) RTC 1 year

## 2017-10-10 NOTE — Patient Instructions (Signed)
GO TO THE LAB : Get the blood work     GO TO THE FRONT DESK Schedule your next appointment for a  Physical exam in 1 year    Check the  blood pressure 2  times a month  Be sure your blood pressure is between 110/65 and  135/85. If it is consistently higher or lower, let me know

## 2017-10-11 NOTE — Assessment & Plan Note (Signed)
HTN: On metoprolol XL 100 mg daily, ambulatory BP is 120/78, sometimes when she is at work, busy, her BP goes to 140/90. For now we agreed to continue with your healthy lifestyle, metoprolol at the same dose and monitor BPs.  If they BPs are more consistently 140/90 or if  elevated at rest will increase metoprolol dose (pulse today 78). CP: resolved, stress test (-) RTC 1 year

## 2017-10-23 ENCOUNTER — Other Ambulatory Visit: Payer: Self-pay | Admitting: Internal Medicine

## 2017-10-23 MED ORDER — METOPROLOL SUCCINATE ER 100 MG PO TB24: 100.0000 mg | ORAL_TABLET | Freq: Every day | ORAL | 6 refills | Status: DC

## 2017-10-23 MED FILL — METOPROLOL SUCCINATE ER 100: 100 | 30 days supply | Qty: 30 | Fill #0

## 2017-11-21 MED FILL — METOPROLOL SUCCINATE ER 100: 100 | 90 days supply | Qty: 90 | Fill #1

## 2017-12-08 DIAGNOSIS — G5623 Lesion of ulnar nerve, bilateral upper limbs: Secondary | ICD-10-CM | POA: Diagnosis not present

## 2017-12-08 DIAGNOSIS — M542 Cervicalgia: Secondary | ICD-10-CM | POA: Diagnosis not present

## 2017-12-08 DIAGNOSIS — G4452 New daily persistent headache (NDPH): Secondary | ICD-10-CM | POA: Diagnosis not present

## 2018-02-27 MED FILL — METOPROLOL SUCCINATE ER 100: 100 | 90 days supply | Qty: 90 | Fill #2

## 2018-05-01 ENCOUNTER — Telehealth: Payer: 59 | Admitting: Physician Assistant

## 2018-05-01 DIAGNOSIS — H1032 Unspecified acute conjunctivitis, left eye: Secondary | ICD-10-CM | POA: Diagnosis not present

## 2018-05-01 MED ORDER — OFLOXACIN 0.3 % OP SOLN: 1.0000 [drp] | Freq: Four times a day (QID) | OPHTHALMIC | 0 refills | Status: DC

## 2018-05-01 MED FILL — OFLOXACIN 0.3% EYE DROPS: 0.3 | 25 days supply | Qty: 5 | Fill #0

## 2018-05-01 NOTE — Progress Notes (Signed)
We are sorry that you are not feeling well.  Here is how we plan to help!  Based on what you have shared with me it looks like you have conjunctivitis.  Conjunctivitis is a common inflammatory or infectious condition of the Hicks that is often referred to as "pink Hicks".  In most cases it is contagious (viral or bacterial). However, not all conjunctivitis requires antibiotics (ex. Allergic).  We have made appropriate suggestions for you based upon your presentation.  I have prescribed Oflaxacin 1-2 drops 4 times a day times 5 days   Please remove your contacts.  If you are worsening with antibiotic drops like you to contact [facility] Hicks care for same-day appointment.  Pink Hicks can be highly contagious.  It is typically spread through direct contact with secretions, or contaminated objects or surfaces that one may have touched.  Strict handwashing is suggested with soap and water is urged.  If not available, use alcohol based had sanitizer.  Avoid unnecessary touching of the Hicks.  If you wear contact lenses, you will need to refrain from wearing them until you see no white discharge from the Hicks for at least 24 hours after being on medication.  You should see symptom improvement in 1-2 days after starting the medication regimen.  Call us if symptoms are not improved in 1-2 days.  Home Care: Wash your hands often! Do not wear your contacts until you complete your treatment plan. Avoid sharing towels, bed linen, personal items with a person who has pink Hicks. See attention for anyone in your home with similar symptoms.  Get Help Right Away If: Your symptoms do not improve. You develop blurred or loss of vision. Your symptoms worsen (increased discharge, pain or redness)  Your e-visit answers were reviewed by a board certified advanced clinical practitioner to complete your personal care plan.  Depending on the condition, your plan could have included both over the counter or prescription medications.  If  there is a problem please reply once you have received a response from your provider.  Your safety is important to [place].  If you have drug allergies check your prescription carefully.    You can use MyChart to ask questions about today's visit, request a non-urgent call back, or ask for a work or school excuse for 24 hours related to this e-Visit. If it has been greater than 24 hours you will need to follow up with your provider, or enter a new e-Visit to address those concerns.   You will get an e-mail in the next two days asking about your experience.  I hope that your e-visit has been valuable and will speed your recovery. Thank you for using e-visits.      ===View-only below this line===   ----- Message -----    From: Wilburn Cornelia    Sent: 05/01/2018  8:06 AM EDT      To: E-Visit Mailing List Subject: E-Visit Submission: Tanya Hicks  E-Visit Submission: [place] --------------------------------  Question: Which of the following have you been experiencing Answer:   One Hicks is red            Hicks itching            Hicks drainage or crusting  Question: Have you had any of the following? Answer:   None of the above  Question: How long have you been having these symptoms? Answer:   Just today  Question: Do you have a fever? Answer:  No, I do not have a fever  Question: Are your symptoms associated with swimming? Answer:   No, I have not been swimming recently  Question: Have your eyes been exposed to any chemicals, creams, or drops that may be causing irritation? Answer:   No  Question: Have you suffered any recent injury to your eyes? Answer:   No  Question: Have you been exposed to anyone with similar symptoms? Answer:   No  Question: Do you wear contact lenses? Answer:   Yes  Question: Have you had any of the following? Answer:   None of the above  Question: Have you had any of the following in the past? Answer:   I have not had any past problems with my  eyes  Question: What medications are you currently using for these symptoms? Answer:   Nothing  Question: Please list your medication allergies that you may have ? (If 'none' , please list as 'none') Answer:   None  Question: Please list any additional comments  Answer:     Question: Are you pregnant? Answer:   I am confident that I am not pregnant  Question: Are you breastfeeding? Answer:   No  A total of 5-10 minutes was spent evaluating this patients questionnaire and formulating a plan of care.

## 2018-05-16 ENCOUNTER — Other Ambulatory Visit: Payer: Self-pay | Admitting: Internal Medicine

## 2018-05-16 MED FILL — METOPROLOL SUCCINATE ER 100: 100 | 90 days supply | Qty: 90 | Fill #0

## 2018-07-04 DIAGNOSIS — Z03818 Encounter for observation for suspected exposure to other biological agents ruled out: Secondary | ICD-10-CM | POA: Diagnosis not present

## 2018-08-08 MED FILL — METOPROLOL SUCCINATE ER 100: 100 | 90 days supply | Qty: 90 | Fill #1

## 2018-09-13 ENCOUNTER — Encounter: Payer: Self-pay | Admitting: Internal Medicine

## 2018-10-03 DIAGNOSIS — L570 Actinic keratosis: Secondary | ICD-10-CM | POA: Diagnosis not present

## 2018-10-26 ENCOUNTER — Other Ambulatory Visit: Payer: Self-pay

## 2018-10-29 ENCOUNTER — Ambulatory Visit (INDEPENDENT_AMBULATORY_CARE_PROVIDER_SITE_OTHER): Payer: 59 | Admitting: Internal Medicine

## 2018-10-29 ENCOUNTER — Encounter: Payer: Self-pay | Admitting: Internal Medicine

## 2018-10-29 ENCOUNTER — Other Ambulatory Visit: Payer: Self-pay

## 2018-10-29 VITALS — BP 110/70 | HR 77 | Temp 98.0°F | Resp 16 | Ht 64.0 in | Wt 150.5 lb

## 2018-10-29 DIAGNOSIS — Z0000 Encounter for general adult medical examination without abnormal findings: Secondary | ICD-10-CM

## 2018-10-29 NOTE — Patient Instructions (Addendum)
GO TO THE LAB : Get the blood work     GO TO THE FRONT DESK Schedule your next appointment   for a physical exam in 1 year  Please try metoprolol XL 100 mg: 1.5 tablets daily Let me know if that works better for you.   BP GOAL is between 110/65 and  135/85. If it is consistently higher or lower, let me know

## 2018-10-29 NOTE — Progress Notes (Signed)
Subjective:    Patient ID: Tanya Hicks, female    DOB: 09-03-1985, 33 y.o.   MRN: [id number]  DOS:  10/29/2018 Type of visit - description: CPX In general feeling well. Few months ago had neck pain and bilateral hand numbness, saw neurosurgeon, eventually symptoms self resolved. Concerned about weight gain despite having a healthy lifestyle  Wt Readings from Last 3 Encounters:  10/29/18 150 lb 8 oz (68.3 kg)  10/10/17 144 lb 9.6 oz (65.6 kg)  05/09/17 135 lb 8 oz (61.5 kg)    Review of Systems  Other than above, a 14 point review of systems is negative      Past Medical History:  Diagnosis Date  . Hypertension   . Kidney stone 01-2014  . Ovarian cyst   . Pregnancy induced hypertension   . PROM (premature rupture of membranes) 08/13/2014  . SVD (spontaneous vaginal delivery) 08/14/2014    Past Surgical History:  Procedure Laterality Date  . WISDOM TOOTH EXTRACTION      Social History   Socioeconomic History  . Marital status: Married    Spouse name: Not on file  . Number of children: 2  . Years of education: Not on file  . Highest education level: Not on file  Occupational History  . Occupation: Psychologist, forensic: [employer]  . Financial resource strain: Not on file  . Food insecurity    Worry: Not on file    Inability: Not on file  . Transportation needs    Medical: Not on file    Non-medical: Not on file  Tobacco Use  . Smoking status: Never Smoker  . Smokeless tobacco: Never Used  Substance and Sexual Activity  . Alcohol use: No    Comment: socially  . Drug use: No  . Sexual activity: Yes  Lifestyle  . Physical activity    Days per week: Not on file    Minutes per session: Not on file  . Stress: Not on file  Relationships  . Social Herbalist on phone: Not on file    Gets together: Not on file    Attends religious service: Not on file    Active member of club or organization: Not on file    Attends  meetings of clubs or organizations: Not on file    Relationship status: Not on file  . Intimate partner violence    Fear of current or ex partner: Not on file    Emotionally abused: Not on file    Physically abused: Not on file    Forced sexual activity: Not on file  Other Topics Concern  . 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 Hypertension Father   . Diabetes Other        GF  . Breast cancer Other        GM, dx in her 36s  . Heart attack Neg Hx   . Colon cancer Neg Hx      Allergies as of 10/29/2018   No Known Allergies     Medication List       Accurate as of October 29, 2018 11:59 PM. If you have any questions, ask your nurse or doctor.        metoprolol succinate 100 MG 24 hr tablet Commonly known as: TOPROL-XL Take  with or immediately following a meal.   ofloxacin 0.3 % ophthalmic solution Commonly known as: Ocuflox Place 1 drop into the left eye 4 (four) times daily.           Objective:   Physical Exam BP 110/70 (BP Location: Left Arm, Patient Position: Sitting, Cuff Size: Small)   Pulse 77   Temp 98 F (36.7 C) (Temporal)   Resp 16   Ht 5\' 4"  (1.626 m)   Wt 150 lb 8 oz (68.3 kg)   LMP 10/29/2018 (Exact Date)   SpO2 100%   BMI 25.83 kg/m  General: Well developed, NAD, BMI noted Neck: No  thyromegaly  HEENT:  Normocephalic . Face symmetric, atraumatic Lungs:  CTA B Normal respiratory effort, no intercostal retractions, no accessory muscle use. Heart: RRR,  no murmur.  No pretibial edema bilaterally  Abdomen:  Not distended, soft, non-tender. No rebound or rigidity.   Skin: Exposed areas without rash. Not pale. Not jaundice Neurologic:  alert & oriented X3.  Speech normal, gait appropriate for age and unassisted Strength symmetric and appropriate for age.  Psych: Cognition and judgment appear intact.  Cooperative with normal attention span and  concentration.  Behavior appropriate. No anxious or depressed appearing.     Assessment      Assessment HTN Ovarian cyst Kidney stone 01/2014 Birth control: Husband had a vasectomy  CP: Stress test normal 05/2017  Plan: Here for CPX HTN: On metoprolol XL 100 g, 1 tablet daily, occasionally at work BP increased to the 140/90.  At home typically okay. We agreed to do a trial taking metoprolol XL 100 mg 1.5 tablets daily hoping to prevent high blood pressures at work without side effects.  She will let me know. RTC 1 year

## 2018-10-29 NOTE — Assessment & Plan Note (Addendum)
-   Td 2016, flu shot at work -Gyn care per gyn - Diet and exercise discussed today.  Has gained some weight. -Labs: CMP, LP, CBC, TSH (not fasting)

## 2018-10-29 NOTE — Progress Notes (Signed)
Pre visit review using our clinic review tool, if applicable. No additional management support is needed unless otherwise documented below in the visit note. 

## 2018-10-30 LAB — CBC WITH DIFFERENTIAL/PLATELET
Basophils Absolute: 0.1 10*3/uL (ref 0.0–0.1)
Basophils Relative: 1.2 % (ref 0.0–3.0)
Eosinophils Absolute: 0.1 10*3/uL (ref 0.0–0.7)
Eosinophils Relative: 2.1 % (ref 0.0–5.0)
HCT: 39.3 % (ref 36.0–46.0)
Hemoglobin: 13.8 g/dL (ref 12.0–15.0)
Lymphocytes Relative: 30.9 % (ref 12.0–46.0)
Lymphs Abs: 1.8 10*3/uL (ref 0.7–4.0)
MCHC: 35 g/dL (ref 30.0–36.0)
MCV: 92.6 fl (ref 78.0–100.0)
Monocytes Absolute: 0.3 10*3/uL (ref 0.1–1.0)
Monocytes Relative: 5.4 % (ref 3.0–12.0)
Neutro Abs: 3.6 10*3/uL (ref 1.4–7.7)
Neutrophils Relative %: 60.4 % (ref 43.0–77.0)
Platelets: 284 10*3/uL (ref 150.0–400.0)
RBC: 4.25 Mil/uL (ref 3.87–5.11)
RDW: 12.5 % (ref 11.5–15.5)
WBC: 6 10*3/uL (ref 4.0–10.5)

## 2018-10-30 LAB — COMPREHENSIVE METABOLIC PANEL
ALT: 18 U/L (ref 0–35)
AST: 16 U/L (ref 0–37)
Albumin: 4.6 g/dL (ref 3.5–5.2)
Alkaline Phosphatase: 60 U/L (ref 39–117)
BUN: 9 mg/dL (ref 6–23)
CO2: 31 mEq/L (ref 19–32)
Calcium: 9.5 mg/dL (ref 8.4–10.5)
Chloride: 104 mEq/L (ref 96–112)
Creatinine, Ser: 0.71 mg/dL (ref 0.40–1.20)
GFR: 93.65 mL/min (ref >60.00)
Glucose, Bld: 100 mg/dL — ABNORMAL HIGH (ref 70–99)
Potassium: 3.8 mEq/L (ref 3.5–5.1)
Sodium: 141 mEq/L (ref 135–145)
Total Bilirubin: 0.6 mg/dL (ref 0.2–1.2)
Total Protein: 6.6 g/dL (ref 6.0–8.3)

## 2018-10-30 LAB — LIPID PANEL
Cholesterol: 161 mg/dL (ref 0–200)
HDL: 53.7 mg/dL (ref >39.00)
LDL Cholesterol: 85 mg/dL (ref 0–99)
NonHDL: 107.52
Total CHOL/HDL Ratio: 3
Triglycerides: 111 mg/dL (ref 0.0–149.0)
VLDL: 22.2 mg/dL (ref 0.0–40.0)

## 2018-10-30 LAB — TSH: TSH: 1.31 u[IU]/mL (ref 0.35–4.50)

## 2018-10-30 NOTE — Assessment & Plan Note (Signed)
Here for CPX HTN: On metoprolol XL 100 g, 1 tablet daily, occasionally at work BP increased to the 140/90.  At home typically okay. We agreed to do a trial taking metoprolol XL 100 mg 1.5 tablets daily hoping to prevent high blood pressures at work without side effects.  She will let me know. RTC 1 year

## 2018-11-06 MED FILL — METOPROLOL SUCCINATE ER 100: 100 | 90 days supply | Qty: 90 | Fill #2

## 2018-12-19 DIAGNOSIS — H5213 Myopia, bilateral: Secondary | ICD-10-CM | POA: Diagnosis not present

## 2019-02-04 MED FILL — METOPROLOL SUCCINATE ER 100: 100 | 90 days supply | Qty: 90 | Fill #3

## 2019-04-09 DIAGNOSIS — R52 Pain, unspecified: Secondary | ICD-10-CM | POA: Diagnosis not present

## 2019-04-09 DIAGNOSIS — Z130 Encounter for screening for diseases of the blood and blood-forming organs and certain disorders involving the immune mechanism: Secondary | ICD-10-CM | POA: Diagnosis not present

## 2019-04-09 DIAGNOSIS — E282 Polycystic ovarian syndrome: Secondary | ICD-10-CM | POA: Diagnosis not present

## 2019-04-09 DIAGNOSIS — Z01419 Encounter for gynecological examination (general) (routine) without abnormal findings: Secondary | ICD-10-CM | POA: Diagnosis not present

## 2019-04-09 DIAGNOSIS — Z6826 Body mass index (BMI) 26.0-26.9, adult: Secondary | ICD-10-CM | POA: Diagnosis not present

## 2019-04-09 DIAGNOSIS — Z1389 Encounter for screening for other disorder: Secondary | ICD-10-CM | POA: Diagnosis not present

## 2019-04-15 DIAGNOSIS — E282 Polycystic ovarian syndrome: Secondary | ICD-10-CM | POA: Diagnosis not present

## 2019-04-15 DIAGNOSIS — R52 Pain, unspecified: Secondary | ICD-10-CM | POA: Diagnosis not present

## 2019-05-06 ENCOUNTER — Other Ambulatory Visit: Payer: Self-pay | Admitting: Internal Medicine

## 2019-05-06 MED FILL — METOPROLOL SUCCINATE ER 100: 100 | 90 days supply | Qty: 90 | Fill #0

## 2019-08-26 MED FILL — METOPROLOL SUCCINATE ER 100: 100 | 90 days supply | Qty: 90 | Fill #0

## 2019-10-28 ENCOUNTER — Telehealth: Payer: 59 | Admitting: Nurse Practitioner

## 2019-10-28 DIAGNOSIS — B027 Disseminated zoster: Secondary | ICD-10-CM | POA: Diagnosis not present

## 2019-10-28 MED ORDER — GABAPENTIN 300 MG PO CAPS: 300.0000 mg | ORAL_CAPSULE | Freq: Three times a day (TID) | ORAL | 0 refills | Status: DC

## 2019-10-28 MED ORDER — VALACYCLOVIR HCL 1 G PO TABS: 1000.0000 mg | ORAL_TABLET | Freq: Three times a day (TID) | ORAL | 0 refills | Status: DC

## 2019-10-28 MED FILL — GABAPENTIN 300 MG CAPSULE: 300 | 10 days supply | Qty: 30 | Fill #0

## 2019-10-28 MED FILL — valACYclovir HCL 1 GM TABS: 1 | 7 days supply | Qty: 21 | Fill #0

## 2019-10-28 NOTE — Progress Notes (Signed)
E-visit for Shingles   We are sorry that you are not feeling well. Here is how we plan to help!  Based on what you shared with me it looks like you have shingles.  Shingles or herpes zoster, is a common infection of the nerves.  It is a painful rash caused by the herpes zoster virus.  This is the same virus that causes chickenpox.  After a person has chickenpox, the virus remains inactive in the nerve cells.  Years later, the virus can become active again and travel to the skin.  It typically will appear on one side of the face or body.  Burning or shooting pain, tingling, or itching are early signs of the infection.  Blisters typically scab over in 7 to 10 days and clear up within 2-4 weeks. Shingles is only contagious to people that have never had the chickenpox, the chickenpox vaccine, or anyone who has a compromised immune system.  You should avoid contact with these type of people until your blisters scab over.  I have prescribed Valacyclovir 1g three times daily for 7 days and also Gabapentin 300mg twice daily as needed for pain   HOME CARE: Apply ice packs (wrapped in a thin towel), cool compresses, or soak in cool bath to help reduce pain. Use calamine lotion to calm itchy skin. Avoid scratching the rash. Avoid direct sunlight.  GET HELP RIGHT AWAY IF: Symptoms that don't away after treatment. A rash or blisters near your eye. Increased drainage, fever, or rash after treatment. Severe pain that doesn't go away.   MAKE SURE YOU   Understand these instructions. Will watch your condition. Will get help right away if you are not doing well or get worse.  Thank you for choosing an e-visit.  Your e-visit answers were reviewed by a board certified advanced clinical practitioner to complete your personal care plan. Depending upon the condition, your plan could have included both over the counter or prescription medications.  Please review your pharmacy choice. Make sure the pharmacy  is open so you can pick up prescription now. If there is a problem, you may contact your provider through MyChart messaging and have the prescription routed to another pharmacy.  Your safety is important to us. If you have drug allergies check your prescription carefully.   For the next 24 hours you can use MyChart to ask questions about today's visit, request a non-urgent call back, or ask for a work or school excuse. You will get an email in the next two days asking about your experience. I hope that your e-visit has been valuable and will speed your recovery.  5-10 minutes spent reviewing and documenting in chart.  

## 2019-11-29 MED FILL — METOPROLOL SUCCINATE ER 100: 100 | 90 days supply | Qty: 90 | Fill #1

## 2020-01-10 IMAGING — DX DG CHEST 2V
2 series · 2 of 2 positions shown · non-contrast
Comparison: 01/14/2015 chest radiograph.

CLINICAL DATA: Chest pain

EXAM:
CHEST - 2 VIEW

[chest pa]
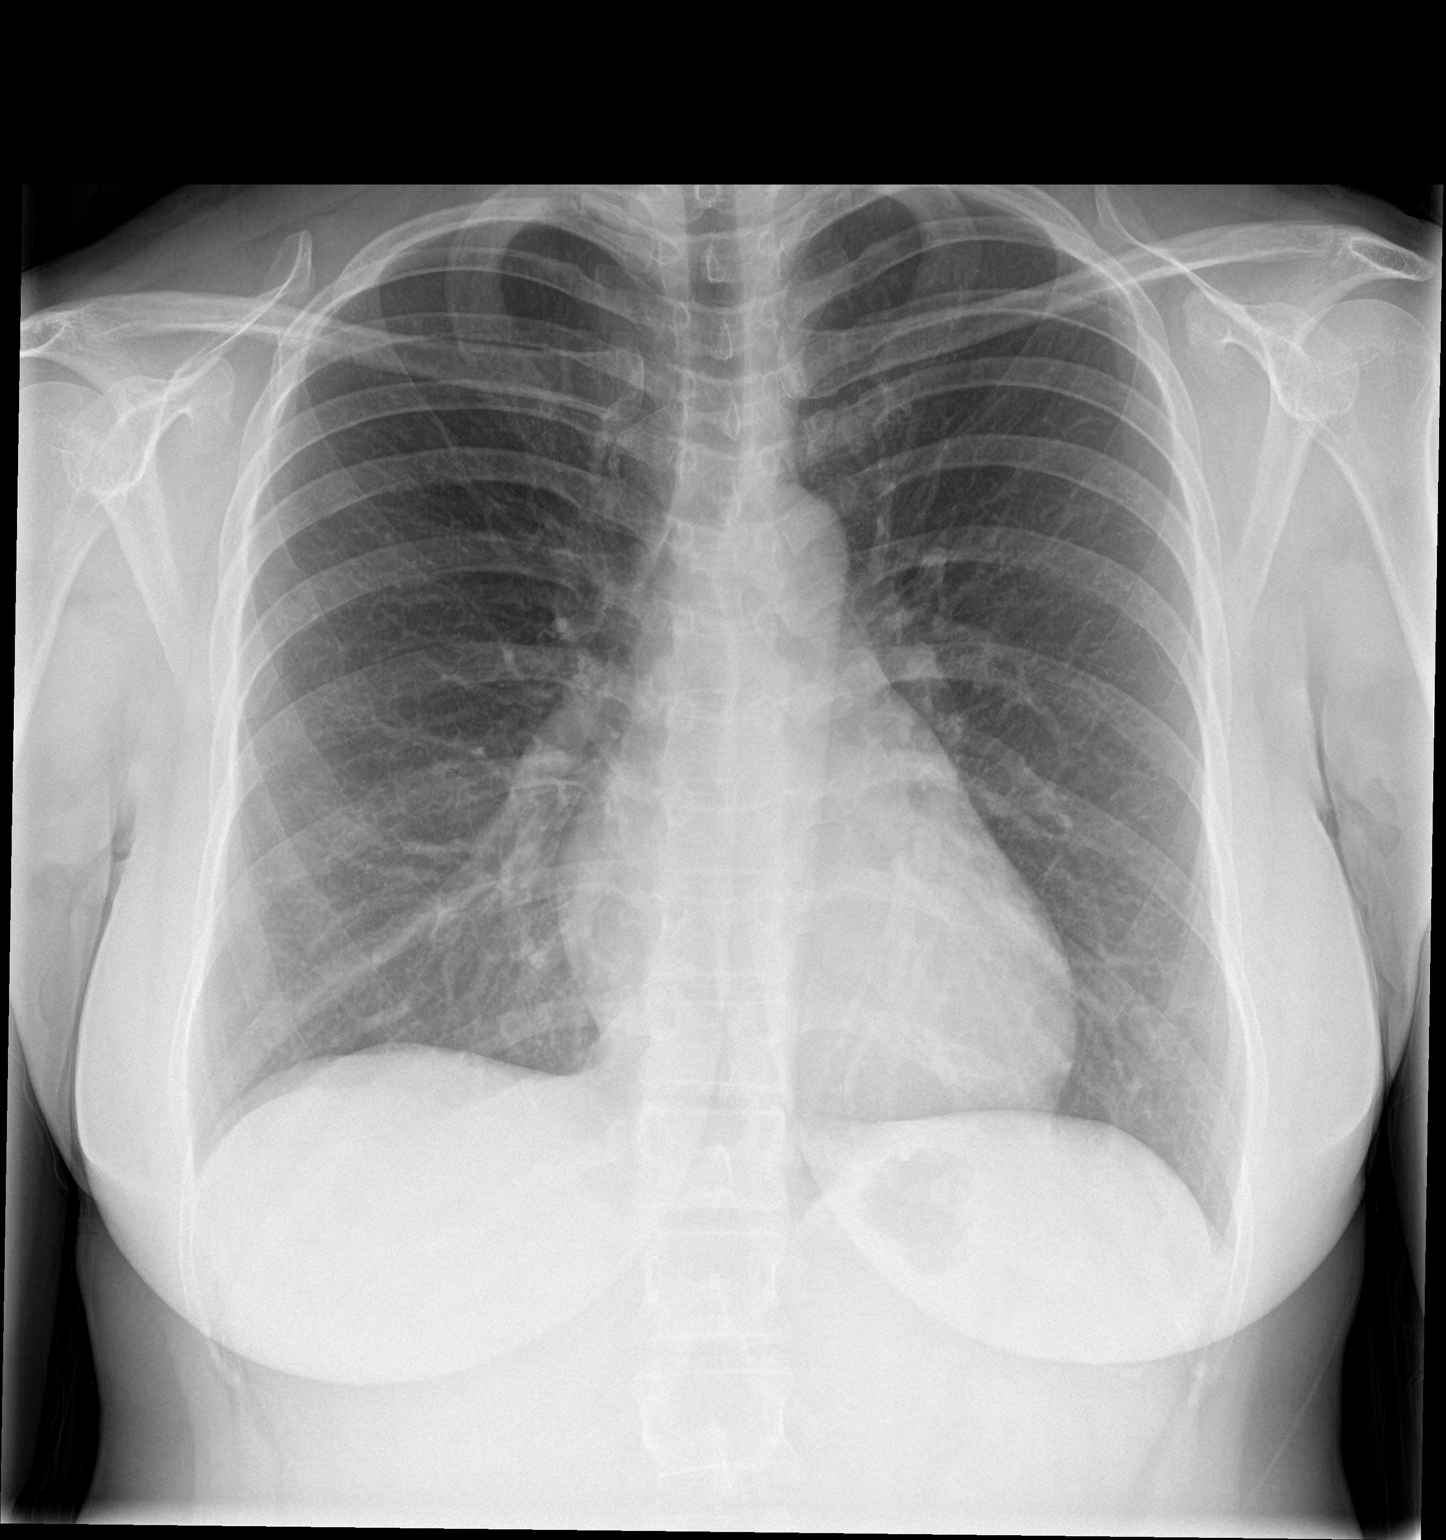

[chest lat]
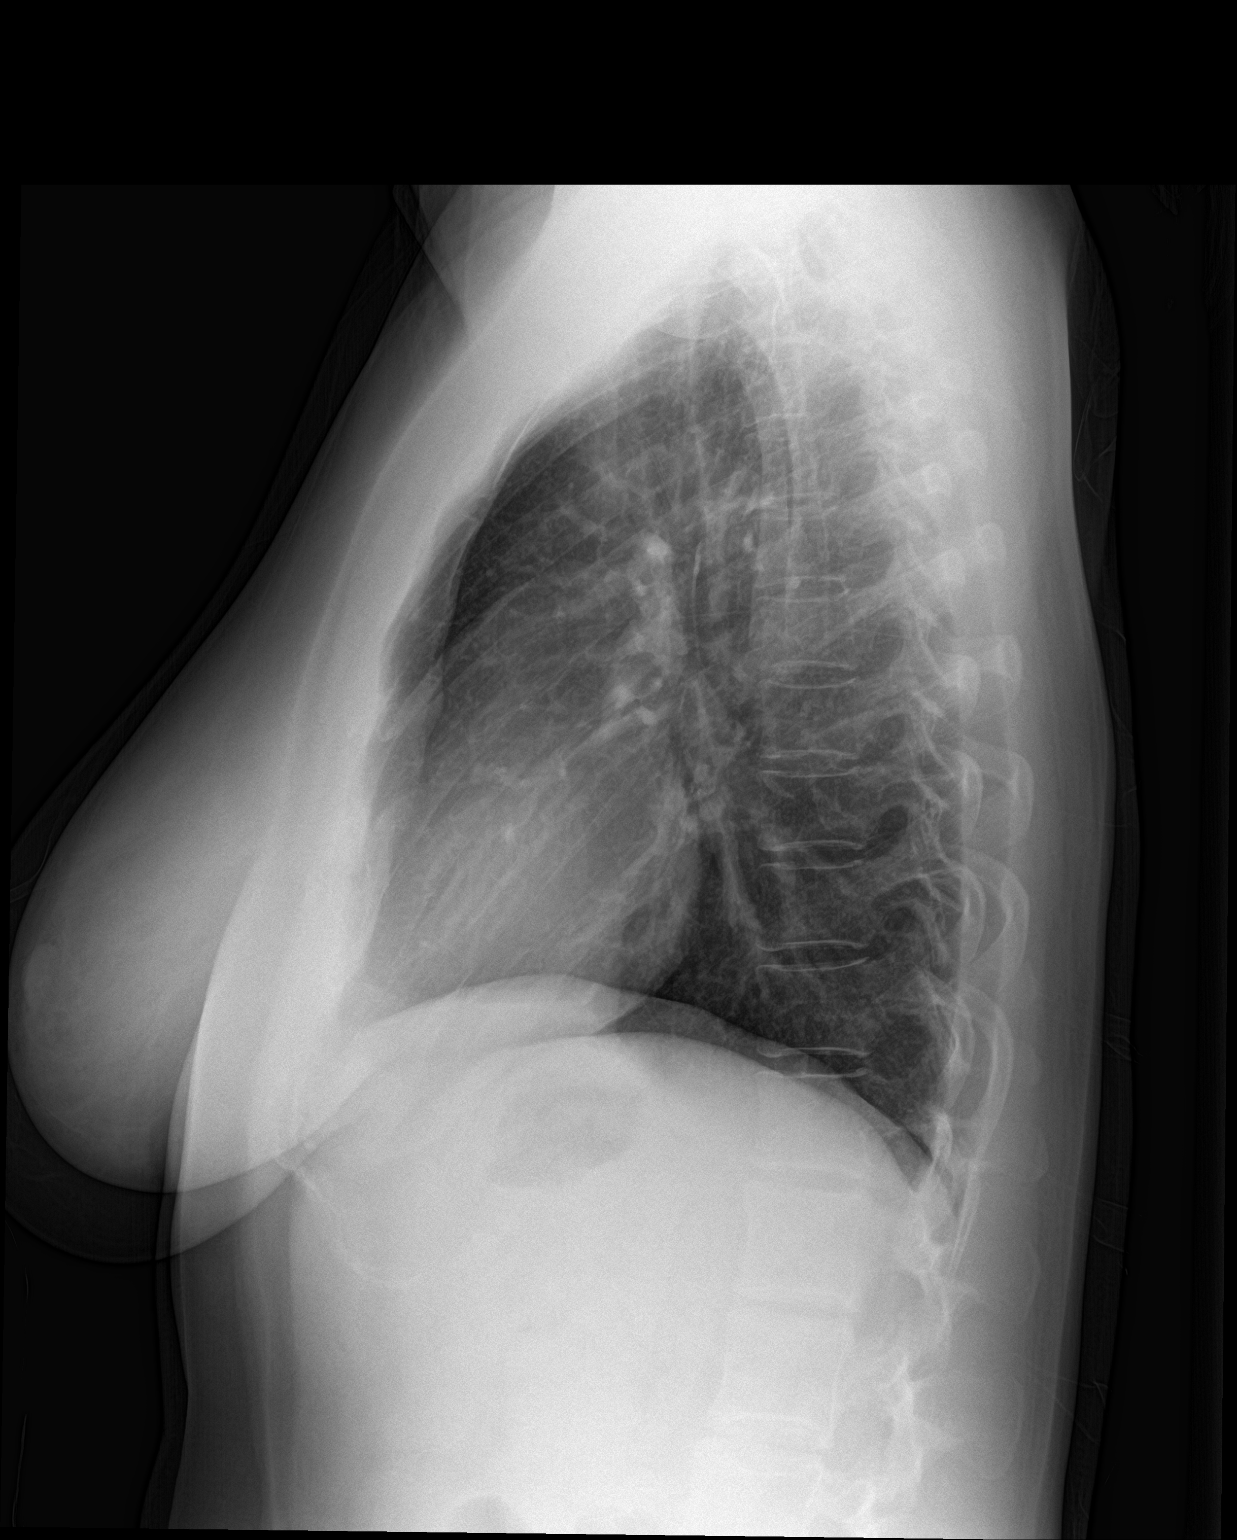

[2 of 2 positions shown; findings below may reference images not displayed]

FINDINGS: Stable cardiomediastinal silhouette with normal heart size. No
pneumothorax. No pleural effusion. Lungs appear clear, with no acute
consolidative airspace disease and no pulmonary edema.
IMPRESSION: No active cardiopulmonary disease.

## 2020-02-24 ENCOUNTER — Other Ambulatory Visit: Payer: Self-pay | Admitting: Internal Medicine

## 2020-02-24 MED FILL — METOPROLOL SUCCINATE ER 100: 100 | 30 days supply | Qty: 30 | Fill #0

## 2020-03-23 ENCOUNTER — Telehealth: Payer: Self-pay | Admitting: Internal Medicine

## 2020-03-23 ENCOUNTER — Other Ambulatory Visit: Payer: Self-pay | Admitting: Internal Medicine

## 2020-03-23 MED ORDER — METOPROLOL SUCCINATE ER 100 MG PO TB24: 100.0000 mg | ORAL_TABLET | Freq: Every day | ORAL | 0 refills | Status: DC

## 2020-03-23 MED FILL — METOPROLOL SUCCINATE ER 100: 100 | 30 days supply | Qty: 30 | Fill #0

## 2020-03-23 NOTE — Telephone Encounter (Signed)
30 day supply sent

## 2020-03-23 NOTE — Telephone Encounter (Signed)
Patient made appointment for next week, patient would like enough medication sent to her pharmacy until her appointment    Medication: metoprolol succinate (TOPROL-XL) 100 MG 24 hr tablet [[id number]]    Has the patient contacted their pharmacy? No. (If no, request that the patient contact the pharmacy for the refill.) (If yes, when and what did the pharmacy advise?)  Preferred Pharmacy (with phone number or street name):  [place], [place] - [address] [facility].  [address] [place] [address]  Phone:  [phone] Fax:  [phone]  DEA #:  -- Agent: Please be advised that RX refills may take up to 3 business days. We ask that you follow-up with your pharmacy.

## 2020-04-03 ENCOUNTER — Other Ambulatory Visit: Payer: Self-pay

## 2020-04-03 ENCOUNTER — Ambulatory Visit (INDEPENDENT_AMBULATORY_CARE_PROVIDER_SITE_OTHER): Payer: 59 | Admitting: Internal Medicine

## 2020-04-03 ENCOUNTER — Encounter: Payer: Self-pay | Admitting: Internal Medicine

## 2020-04-03 VITALS — BP 128/78 | HR 81 | Temp 99.0°F | Ht 64.0 in | Wt 153.0 lb

## 2020-04-03 DIAGNOSIS — E010 Iodine-deficiency related diffuse (endemic) goiter: Secondary | ICD-10-CM | POA: Diagnosis not present

## 2020-04-03 DIAGNOSIS — Z0000 Encounter for general adult medical examination without abnormal findings: Secondary | ICD-10-CM

## 2020-04-03 NOTE — Progress Notes (Signed)
   Subjective:    Patient ID: Tanya Hicks, female    DOB: [birth year], 35 y.o.   MRN: [id number]  DOS:  04/03/2020 Type of visit - description: CPX Since the last office visit she is doing well. She did report difficulties losing weight.   Review of Systems  Other than above, a 14 point review of systems is negative     Past Medical History:  Diagnosis Date  . Hypertension   . Kidney stone 01-2014  . Ovarian cyst   . Pregnancy induced hypertension   . PROM (premature rupture of membranes) 08/13/2014  . SVD (spontaneous vaginal delivery) 08/14/2014    Past Surgical History:  Procedure Laterality Date  . WISDOM TOOTH EXTRACTION      Allergies as of 04/03/2020   No Known Allergies     Medication List       Accurate as of April 03, 2020 11:59 PM. If you have any questions, ask your nurse or doctor.        STOP taking these medications   gabapentin 300 MG capsule Commonly known as: NEURONTIN Stopped by: Kathlene November, MD   ofloxacin 0.3 % ophthalmic solution Commonly known as: Ocuflox Stopped by: Kathlene November, MD   valACYclovir 1000 MG tablet Commonly known as: Valtrex Stopped by: Kathlene November, MD     TAKE these medications   metoprolol succinate 100 MG 24 hr tablet Commonly known as: TOPROL-XL Take 1 tablet (100 mg total) by mouth daily. With or immediately following a meal.          Objective:   Physical Exam BP 128/78 (BP Location: Left Arm, Patient Position: Sitting, Cuff Size: Large)   Pulse 81   Temp 99 F (37.2 C) (Oral)   Ht 5\' 4"  (1.626 m)   Wt 153 lb (69.4 kg)   SpO2 98%   BMI 26.26 kg/m   General: Well developed, NAD, BMI noted Neck: Question of thyromegaly.  No nodularity or tenderness noted HEENT:  Normocephalic . Face symmetric, atraumatic Lungs:  CTA B Normal respiratory effort, no intercostal retractions, no accessory muscle use. Heart: RRR,  no murmur.  Abdomen:  Not distended, soft, non-tender. No rebound or rigidity.   Lower  extremities: no pretibial edema bilaterally  Skin: Exposed areas without rash. Not pale. Not jaundice Neurologic:  alert & oriented X3.  Speech normal, gait appropriate for age and unassisted Strength symmetric and appropriate for age.  Psych: Cognition and judgment appear intact.  Cooperative with normal attention span and concentration.  Behavior appropriate. No anxious or depressed appearing.     Assessment      Assessment HTN Ovarian cyst Kidney stone 01/2014 Birth control: Husband had a vasectomy  CP: Stress test normal 05/2017 Shingles: L leg 10/2019    Plan: Here for CPX HTN: BP today, good ambulatory BPs, refill metoprolol. Thyromegaly?:  See physical exam, check ultrasound. RTC 1 year  This visit occurred during the SARS-CoV-2 public health emergency.  Safety protocols were in place, including screening questions prior to the visit, additional usage of staff PPE, and extensive cleaning of exam room while observing appropriate contact time as indicated for disinfecting solutions.

## 2020-04-03 NOTE — Patient Instructions (Addendum)
Check the  blood pressure from time to time BP GOAL is between 110/65 and  135/85. If it is consistently higher or lower, let me know    GO TO THE LAB : Get the blood work     [place], [place] Come back for physical exam in 1 year   Stop by the first floor: Arrange your thyroid ultrasound

## 2020-04-05 ENCOUNTER — Encounter: Payer: Self-pay | Admitting: Internal Medicine

## 2020-04-05 NOTE — Assessment & Plan Note (Signed)
-   Td 2016 - covid vax x2, booster rec  - flu shot at work -Gyn care per gyn - Diet and exercise: She is very active, doing [employer] 4 times a week, unable to lose much weight, calorie counting? -Labs:  CMP, FLP, CBC, TSH, hep C, vitamin D (not on vitamin D supplements just a MVI)

## 2020-04-05 NOTE — Assessment & Plan Note (Signed)
Here for CPX HTN: BP today, good ambulatory BPs, refill metoprolol. Thyromegaly?:  See physical exam, check ultrasound. RTC 1 year

## 2020-04-06 LAB — HEPATITIS C ANTIBODY
Hepatitis C Ab: NONREACTIVE
SIGNAL TO CUT-OFF: 0.01 (ref <1.00)

## 2020-04-06 LAB — COMPREHENSIVE METABOLIC PANEL
AG Ratio: 2.2 (calc) (ref 1.0–2.5)
ALT: 29 U/L (ref 6–29)
AST: 25 U/L (ref 10–30)
Albumin: 4.7 g/dL (ref 3.6–5.1)
Alkaline phosphatase (APISO): 77 U/L (ref 31–125)
BUN: 14 mg/dL (ref 7–25)
CO2: 24 mmol/L (ref 20–32)
Calcium: 9.7 mg/dL (ref 8.6–10.2)
Chloride: 101 mmol/L (ref 98–110)
Creat: 0.69 mg/dL (ref 0.50–1.10)
Globulin: 2.1 g/dL (calc) (ref 1.9–3.7)
Glucose, Bld: 82 mg/dL (ref 65–99)
Potassium: 3.7 mmol/L (ref 3.5–5.3)
Sodium: 138 mmol/L (ref 135–146)
Total Bilirubin: 0.7 mg/dL (ref 0.2–1.2)
Total Protein: 6.8 g/dL (ref 6.1–8.1)

## 2020-04-06 LAB — LIPID PANEL
Cholesterol: 187 mg/dL (ref <200)
HDL: 68 mg/dL (ref >=50)
LDL Cholesterol (Calc): 97 mg/dL (calc)
Non-HDL Cholesterol (Calc): 119 mg/dL (calc) (ref <130)
Total CHOL/HDL Ratio: 2.8 (calc) (ref <5.0)
Triglycerides: 119 mg/dL (ref <150)

## 2020-04-06 LAB — CBC WITH DIFFERENTIAL/PLATELET
Absolute Monocytes: 516 cells/uL (ref 200–950)
Basophils Absolute: 77 cells/uL (ref 0–200)
Basophils Relative: 0.9 %
Eosinophils Absolute: 413 cells/uL (ref 15–500)
Eosinophils Relative: 4.8 %
HCT: 40.8 % (ref 35.0–45.0)
Hemoglobin: 14.3 g/dL (ref 11.7–15.5)
Lymphs Abs: 2124 cells/uL (ref 850–3900)
MCH: 31.9 pg (ref 27.0–33.0)
MCHC: 35 g/dL (ref 32.0–36.0)
MCV: 91.1 fL (ref 80.0–100.0)
MPV: 8.8 fL (ref 7.5–12.5)
Monocytes Relative: 6 %
Neutro Abs: 5470 cells/uL (ref 1500–7800)
Neutrophils Relative %: 63.6 %
Platelets: 247 10*3/uL (ref 140–400)
RBC: 4.48 10*6/uL (ref 3.80–5.10)
RDW: 11.6 % (ref 11.0–15.0)
Total Lymphocyte: 24.7 %
WBC: 8.6 10*3/uL (ref 3.8–10.8)

## 2020-04-06 LAB — VITAMIN D 25 HYDROXY (VIT D DEFICIENCY, FRACTURES): Vit D, 25-Hydroxy: 30 ng/mL (ref 30–100)

## 2020-04-06 LAB — TSH: TSH: 1.38 mIU/L

## 2020-04-08 ENCOUNTER — Ambulatory Visit (HOSPITAL_BASED_OUTPATIENT_CLINIC_OR_DEPARTMENT_OTHER)
Admission: RE | Admit: 2020-04-08 | Discharge: 2020-04-08 | Disposition: A | Discharge status: Home / Self Care | Payer: 59 | Source: Ambulatory Visit | Attending: Internal Medicine | Admitting: Internal Medicine

## 2020-04-08 ENCOUNTER — Other Ambulatory Visit: Payer: Self-pay

## 2020-04-08 DIAGNOSIS — E042 Nontoxic multinodular goiter: Secondary | ICD-10-CM | POA: Diagnosis not present

## 2020-04-08 DIAGNOSIS — E010 Iodine-deficiency related diffuse (endemic) goiter: Secondary | ICD-10-CM | POA: Diagnosis not present

## 2020-04-09 DIAGNOSIS — Z1389 Encounter for screening for other disorder: Secondary | ICD-10-CM | POA: Diagnosis not present

## 2020-04-09 DIAGNOSIS — Z6826 Body mass index (BMI) 26.0-26.9, adult: Secondary | ICD-10-CM | POA: Diagnosis not present

## 2020-04-09 DIAGNOSIS — Z01419 Encounter for gynecological examination (general) (routine) without abnormal findings: Secondary | ICD-10-CM | POA: Diagnosis not present

## 2020-04-09 DIAGNOSIS — Z124 Encounter for screening for malignant neoplasm of cervix: Secondary | ICD-10-CM | POA: Diagnosis not present

## 2020-04-09 DIAGNOSIS — Z1151 Encounter for screening for human papillomavirus (HPV): Secondary | ICD-10-CM | POA: Diagnosis not present

## 2020-04-10 DIAGNOSIS — Z1151 Encounter for screening for human papillomavirus (HPV): Secondary | ICD-10-CM | POA: Diagnosis not present

## 2020-04-10 DIAGNOSIS — Z124 Encounter for screening for malignant neoplasm of cervix: Secondary | ICD-10-CM | POA: Diagnosis not present

## 2020-04-10 LAB — HM PAP SMEAR: HM Pap smear: NEGATIVE

## 2020-04-24 ENCOUNTER — Other Ambulatory Visit: Payer: Self-pay | Admitting: Internal Medicine

## 2020-04-24 MED FILL — METOPROLOL SUCCINATE ER 100: 100 | 90 days supply | Qty: 90 | Fill #0

## 2020-04-30 ENCOUNTER — Other Ambulatory Visit (HOSPITAL_COMMUNITY): Payer: Self-pay | Admitting: Obstetrics and Gynecology

## 2020-04-30 DIAGNOSIS — R300 Dysuria: Secondary | ICD-10-CM | POA: Diagnosis not present

## 2020-04-30 DIAGNOSIS — Z1389 Encounter for screening for other disorder: Secondary | ICD-10-CM | POA: Diagnosis not present

## 2020-04-30 DIAGNOSIS — R102 Pelvic and perineal pain: Secondary | ICD-10-CM | POA: Diagnosis not present

## 2020-05-12 ENCOUNTER — Encounter: Payer: Self-pay | Admitting: Internal Medicine

## 2020-05-25 ENCOUNTER — Encounter: Payer: Self-pay | Admitting: Internal Medicine

## 2020-05-27 ENCOUNTER — Encounter: Payer: 59 | Admitting: Internal Medicine

## 2020-06-15 ENCOUNTER — Telehealth: Payer: 59 | Admitting: Family

## 2020-06-15 ENCOUNTER — Other Ambulatory Visit (HOSPITAL_COMMUNITY): Payer: Self-pay

## 2020-06-15 DIAGNOSIS — L301 Dyshidrosis [pompholyx]: Secondary | ICD-10-CM

## 2020-06-15 MED ORDER — TRIAMCINOLONE ACETONIDE 0.5 % EX OINT
1.0000 | TOPICAL_OINTMENT | Freq: Two times a day (BID) | CUTANEOUS | 1 refills | Status: DC
Start: 2020-06-15 — End: 2021-07-26
  Filled 2020-06-15: qty 60, 30d supply, fill #0

## 2020-06-15 NOTE — Progress Notes (Signed)
E Visit for Rash  We are sorry that you are not feeling well. Here is how we plan to help!  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 I have sent in kenalog cream that you will use twice a day.     HOME CARE:   Take cool showers and avoid direct sunlight.  Apply cool compress or wet dressings.  Take a bath in an oatmeal bath.  Sprinkle content of one Aveeno packet under running faucet with comfortably warm water.  Bathe for 15-20 minutes, 1-2 times daily.  Pat dry with a towel. Do not rub the rash.  Use hydrocortisone cream.  Take an antihistamine like Benadryl for widespread rashes that itch.  The adult dose of Benadryl is 25-50 mg by mouth 4 times daily.  Caution:  This type of medication may cause sleepiness.  Do not drink alcohol, drive, or operate dangerous machinery while taking antihistamines.  Do not take these medications if you have prostate enlargement.  Read package instructions thoroughly on all medications that you take.  GET HELP RIGHT AWAY IF:   Symptoms don't go away after treatment.  Severe itching that persists.  If you rash spreads or swells.  If you rash begins to smell.  If it blisters and opens or develops a yellow-brown crust.  You develop a fever.  You have a sore throat.  You become short of breath.  MAKE SURE YOU:  Understand these instructions. Will watch your condition. Will get help right away if you are not doing well or get worse.  Thank you for choosing an e-visit. Your e-visit answers were reviewed by a board certified advanced clinical practitioner to complete your personal care plan. Depending upon the condition, your plan could have included both over the counter or prescription medications. Please review your pharmacy choice. Be sure that the pharmacy you have chosen is open so that you can pick up your prescription now.  If there is a problem you may message your provider in [place] to have the  prescription routed to another pharmacy. Your safety is important to [place]. If you have drug allergies check your prescription carefully.  For the next 24 hours, you can use MyChart to ask questions about today's visit, request a non-urgent call back, or ask for a work or school excuse from your e-visit provider. You will get an email in the next two days asking about your experience. I hope that your e-visit has been valuable and will speed your recovery.     Approximately 5 minutes was spent documenting and reviewing patient's chart.

## 2020-06-22 ENCOUNTER — Telehealth: Payer: 59 | Admitting: Family

## 2020-06-22 NOTE — Progress Notes (Signed)
Based on what you shared with me, I feel your condition warrants further evaluation and I recommend that you be seen in a face to face office visit.  I am sorry, but I can not do an Evisit through you chart for him. We also do not treat children through Evisit.    NOTE: If you entered your credit card information for this eVisit, you will not be charged. You may see a "hold" on your card for the $35 but that hold will drop off and you will not have a charge processed.   If you are having a true medical emergency please call 911.      For an urgent face to face visit, [place] has six urgent care centers for your convenience:     [facility] [place] Get Driving Directions [phone] [address] [place], [place] [address] . 8 am - 4 pm Monday - Friday    [facility]) Get Driving Directions [phone] [address] [place], [place] [address] . 8 am to 8 pm Monday-Friday . 10 am to 6 pm [facility]) Get Driving Directions [phone]  [address] [place],  [place]  [address] . 8 am to 8 pm Monday-Friday . 8 am to 4 pm [facility] at [facility] Get Driving Directions [phone] [place], [place], [place] [address] . 8 am to 8 pm Monday-Friday . 8 am to 4 pm [facility] at [facility] Get Driving Directions  [phone] [address] [place], [place] [address] . 8 am to 8 pm Monday-Friday . 8 am to 4 pm [facility] at [place] Get Driving Directions [phone] [address], [place], [place] [address] . 8 am to 8 pm Monday-Friday . 8 am to 4 pm Saturday-Sunday     Your MyChart E-visit questionnaire answers were reviewed by a board certified advanced clinical  practitioner to complete your personal care plan based on your specific symptoms.  Thank you for using e-Visits.

## 2020-07-24 ENCOUNTER — Other Ambulatory Visit (HOSPITAL_COMMUNITY): Payer: Self-pay

## 2020-07-24 MED FILL — Metoprolol Succinate Tab ER 24HR 100 MG (Tartrate Equiv): ORAL | 90 days supply | Qty: 90 | Fill #0 | Status: AC

## 2020-09-01 ENCOUNTER — Telehealth: Payer: 59 | Admitting: Emergency Medicine

## 2020-09-01 ENCOUNTER — Other Ambulatory Visit (HOSPITAL_COMMUNITY): Payer: Self-pay

## 2020-09-01 DIAGNOSIS — R059 Cough, unspecified: Secondary | ICD-10-CM | POA: Diagnosis not present

## 2020-09-01 MED ORDER — AMOXICILLIN-POT CLAVULANATE 875-125 MG PO TABS
1.0000 | ORAL_TABLET | Freq: Two times a day (BID) | ORAL | 0 refills | Status: DC
  Filled 2020-09-01: qty 14, 7d supply, fill #0

## 2020-09-01 MED ORDER — BENZONATATE 100 MG PO CAPS
100.0000 mg | ORAL_CAPSULE | Freq: Two times a day (BID) | ORAL | 0 refills | Status: DC | PRN
  Filled 2020-09-01: qty 20, 10d supply, fill #0

## 2020-09-01 NOTE — Progress Notes (Signed)
We are sorry that you are not feeling well.  Here is how we plan to help!  Based on your presentation I believe you most likely have a cough due to a bacteria.  I have prescribed an antibiotic (Augmentin) that will treat both your cough and the ear/sinus pressure and pain.  I've also prescribed tessalon perles for your cough.  From your responses in the eVisit questionnaire you describe inflammation in the upper respiratory tract which is causing a significant cough.  This is commonly called Bronchitis and has four common causes:   Allergies Viral Infections Acid Reflux Bacterial Infection Allergies, viruses and acid reflux are treated by controlling symptoms or eliminating the cause. An example might be a cough caused by taking certain blood pressure medications. You stop the cough by changing the medication. Another example might be a cough caused by acid reflux. Controlling the reflux helps control the cough.  USE OF BRONCHODILATOR ("RESCUE") INHALERS: There is a risk from using your bronchodilator too frequently.  The risk is that over-reliance on a medication which only relaxes the muscles surrounding the breathing tubes can reduce the effectiveness of medications prescribed to reduce swelling and congestion of the tubes themselves.  Although you feel brief relief from the bronchodilator inhaler, your asthma may actually be worsening with the tubes becoming more swollen and filled with mucus.  This can delay other crucial treatments, such as oral steroid medications. If you need to use a bronchodilator inhaler daily, several times per day, you should discuss this with your provider.  There are probably better treatments that could be used to keep your asthma under control.     HOME CARE Only take medications as instructed by your medical team. Complete the entire course of an antibiotic. Drink plenty of fluids and get plenty of rest. Avoid close contacts especially the very young and the  elderly Cover your mouth if you cough or cough into your sleeve. Always remember to wash your hands A steam or ultrasonic humidifier can help congestion.   GET HELP RIGHT AWAY IF: You develop worsening fever. You become short of breath You cough up blood. Your symptoms persist after you have completed your treatment plan MAKE SURE YOU  Understand these instructions. Will watch your condition. Will get help right away if you are not doing well or get worse.    Thank you for choosing an e-visit.  Your e-visit answers were reviewed by a board certified advanced clinical practitioner to complete your personal care plan. Depending upon the condition, your plan could have included both over the counter or prescription medications.  Please review your pharmacy choice. Make sure the pharmacy is open so you can pick up prescription now. If there is a problem, you may contact your provider through [employer] and have the prescription routed to another pharmacy.  Your safety is important to [place]. If you have drug allergies check your prescription carefully.   For the next 24 hours you can use MyChart to ask questions about today's visit, request a non-urgent call back, or ask for a work or school excuse. You will get an email in the next two days asking about your experience. I hope that your e-visit has been valuable and will speed your recovery.  Approximately 5 minutes was used in reviewing the patient's chart, questionnaire, prescribing medications, and documentation.

## 2020-09-18 ENCOUNTER — Encounter: Payer: Self-pay | Admitting: Physician Assistant

## 2020-09-18 ENCOUNTER — Other Ambulatory Visit (HOSPITAL_COMMUNITY): Payer: Self-pay

## 2020-09-18 ENCOUNTER — Telehealth: Payer: 59 | Admitting: Physician Assistant

## 2020-09-18 DIAGNOSIS — J019 Acute sinusitis, unspecified: Secondary | ICD-10-CM

## 2020-09-18 DIAGNOSIS — J40 Bronchitis, not specified as acute or chronic: Secondary | ICD-10-CM

## 2020-09-18 DIAGNOSIS — B9689 Other specified bacterial agents as the cause of diseases classified elsewhere: Secondary | ICD-10-CM | POA: Diagnosis not present

## 2020-09-18 MED ORDER — PREDNISONE 10 MG (21) PO TBPK
ORAL_TABLET | ORAL | 0 refills | Status: DC
  Filled 2020-09-18: qty 21, 6d supply, fill #0

## 2020-09-18 MED ORDER — BENZONATATE 100 MG PO CAPS
100.0000 mg | ORAL_CAPSULE | Freq: Three times a day (TID) | ORAL | 0 refills | Status: DC | PRN
  Filled 2020-09-18: qty 30, 10d supply, fill #0

## 2020-09-18 MED ORDER — DOXYCYCLINE HYCLATE 100 MG PO TABS
100.0000 mg | ORAL_TABLET | Freq: Two times a day (BID) | ORAL | 0 refills | Status: DC
  Filled 2020-09-18: qty 20, 10d supply, fill #0

## 2020-09-18 NOTE — Progress Notes (Signed)
Virtual Visit Consent   Tanya Hicks, you are scheduled for a virtual visit with a [facility] provider today.     Just as with appointments in the office, your consent must be obtained to participate.  Your consent will be active for this visit and any virtual visit you may have with one of our providers in the next 365 days.     If you have a MyChart account, a copy of this consent can be sent to you electronically.  All virtual visits are billed to your insurance company just like a traditional visit in the office.    As this is a virtual visit, video technology does not allow for your provider to perform a traditional examination.  This may limit your provider's ability to fully assess your condition.  If your provider identifies any concerns that need to be evaluated in person or the need to arrange testing (such as labs, EKG, etc.), we will make arrangements to do so.     Although advances in technology are sophisticated, we cannot ensure that it will always work on either your end or our end.  If the connection with a video visit is poor, the visit may have to be switched to a telephone visit.  With either a video or telephone visit, we are not always able to ensure that we have a secure connection.     I need to obtain your verbal consent now.   Are you willing to proceed with your visit today?    [name] has provided verbal consent on 09/18/2020 for a virtual visit (video or telephone).   Mar Daring, PA-C   Date: 09/18/2020 11:56 AM   Virtual Visit via Video Note   I, Mar Daring, connected with  Tanya Hicks  ([id number], 02-07-1986) on 09/18/20 at 12:00 PM EDT by a video-enabled telemedicine application and verified that I am speaking with the correct person using two identifiers.  Location: Patient: Virtual Visit Location Patient: Home Provider: Virtual Visit Location Provider: Home Office   I discussed the limitations of evaluation and management by  telemedicine and the availability of in person appointments. The patient expressed understanding and agreed to proceed.    History of Present Illness: Tanya Hicks is a 35 y.o. who identifies as a female who was assigned female at birth, and is being seen today for continued sinusitis.  HPI: Sinusitis This is a new problem. The current episode started 1 to 4 weeks ago. The problem is unchanged. Associated symptoms include congestion, coughing, ear pain, a hoarse voice, sinus pressure and a sore throat. Pertinent negatives include no shortness of breath. Past treatments include spray decongestants, oral decongestants, lying down, sitting up, saline sprays, antibiotics and acetaminophen. The treatment provided no relief.   Has had 4 covid test through [facility] all negative. She was seen on 09/01/20 and was given Augmentin x 7 days for sinusitis. She did have some symptom improvement but was not completely resolved. Since symptoms have been returning and having increased sinus pressure and congestion, clear fluid behind left TM (had a friend look yesterday), and has a barking, harsh cough.   Problems:  Patient Active Problem List   Diagnosis Date Noted   Pelvic pain 11/10/2016   PCP NOTES >>>>> 01/14/2015   SVD (spontaneous vaginal delivery) 08/14/2014   PROM (premature rupture of membranes) 08/13/2014   Annual physical exam 11/16/2012   HYPERTENSION 12/29/2006    Allergies: No Known Allergies Medications:  Current Outpatient Medications:    benzonatate (TESSALON) 100 MG capsule, Take 1 capsule (100 mg total) by mouth 3 (three) times daily as needed., Disp: 30 capsule, Rfl: 0   doxycycline (VIBRA-TABS) 100 MG tablet, Take 1 tablet (100 mg total) by mouth 2 (two) times daily., Disp: 20 tablet, Rfl: 0   predniSONE (STERAPRED UNI-PAK 21 TAB) 10 MG (21) TBPK tablet, 6 day taper; take as directed on package instructions, Disp: 21 tablet, Rfl: 0   metoprolol succinate (TOPROL-XL) 100 MG 24 hr  tablet, TAKE 1 TABLET (100 MG TOTAL) BY MOUTH DAILY. WITH OR IMMEDIATELY FOLLOWING A MEAL., Disp: 90 tablet, Rfl: 3   phenazopyridine (PYRIDIUM) 100 MG tablet, TAKE 1 TABLET BY MOUTH 3 TIMES DAILY AS NEEDED, Disp: 15 tablet, Rfl: 0   triamcinolone ointment (KENALOG) 0.5 %, Apply 1 application topically 2 (two) times daily., Disp: 60 g, Rfl: 1  Observations/Objective: Patient is well-developed, well-nourished in no acute distress.  Resting comfortably at home.  Head is normocephalic, atraumatic.  No labored breathing. Speech is clear and coherent with logical content.  Patient is alert and oriented at baseline.    Assessment and Plan: 1. Acute bacterial sinusitis - doxycycline (VIBRA-TABS) 100 MG tablet; Take 1 tablet (100 mg total) by mouth 2 (two) times daily.  Dispense: 20 tablet; Refill: 0 - predniSONE (STERAPRED UNI-PAK 21 TAB) 10 MG (21) TBPK tablet; 6 day taper; take as directed on package instructions  Dispense: 21 tablet; Refill: 0 - benzonatate (TESSALON) 100 MG capsule; Take 1 capsule (100 mg total) by mouth 3 (three) times daily as needed.  Dispense: 30 capsule; Refill: 0  2. Bronchitis  - Suspect bacterial sinusitis with bronchitis developing - Will treat with doxycyline, tessalon perles and prednisone as noted above - May continue Mucinex and Nasonex as needed - Tylenol and ibuprofen are good for fevers and/or body aches - Push fluids - Rest as needed - Seek in person evaluation if symptoms do not improve or worsen  Follow Up Instructions: I discussed the assessment and treatment plan with the patient. The patient was provided an opportunity to ask questions and all were answered. The patient agreed with the plan and demonstrated an understanding of the instructions.  A copy of instructions were sent to the patient via MyChart.  The patient was advised to call back or seek an in-person evaluation if the symptoms worsen or if the condition fails to improve as  anticipated.  Time:  I spent 12 minutes with the patient via telehealth technology discussing the above problems/concerns.    Mar Daring, PA-C

## 2020-09-18 NOTE — Patient Instructions (Signed)
Tanya Hicks, thank you for joining Mar Daring, PA-C for today's virtual visit.  While this provider is not your primary care provider (PCP), if your PCP is located in our provider database this encounter information will be shared with them immediately following your visit.  Consent: (Patient) Tanya Hicks provided verbal consent for this virtual visit at the beginning of the encounter.  Current Medications:  Current Outpatient Medications:    benzonatate (TESSALON) 100 MG capsule, Take 1 capsule (100 mg total) by mouth 3 (three) times daily as needed., Disp: 30 capsule, Rfl: 0   doxycycline (VIBRA-TABS) 100 MG tablet, Take 1 tablet (100 mg total) by mouth 2 (two) times daily., Disp: 20 tablet, Rfl: 0   predniSONE (STERAPRED UNI-PAK 21 TAB) 10 MG (21) TBPK tablet, 6 day taper; take as directed on package instructions, Disp: 21 tablet, Rfl: 0   metoprolol succinate (TOPROL-XL) 100 MG 24 hr tablet, TAKE 1 TABLET (100 MG TOTAL) BY MOUTH DAILY. WITH OR IMMEDIATELY FOLLOWING A MEAL., Disp: 90 tablet, Rfl: 3   phenazopyridine (PYRIDIUM) 100 MG tablet, TAKE 1 TABLET BY MOUTH 3 TIMES DAILY AS NEEDED, Disp: 15 tablet, Rfl: 0   triamcinolone ointment (KENALOG) 0.5 %, Apply 1 application topically 2 (two) times daily., Disp: 60 g, Rfl: 1   Medications ordered in this encounter:  Meds ordered this encounter  Medications   doxycycline (VIBRA-TABS) 100 MG tablet    Sig: Take 1 tablet (100 mg total) by mouth 2 (two) times daily.    Dispense:  20 tablet    Refill:  0    Order Specific Question:   Supervising Provider    Answer:   Sabra Heck, BRIAN [[id number]]   predniSONE (STERAPRED UNI-PAK 21 TAB) 10 MG (21) TBPK tablet    Sig: 6 day taper; take as directed on package instructions    Dispense:  21 tablet    Refill:  0    Order Specific Question:   Supervising Provider    Answer:   MILLER, BRIAN [[id number]]   benzonatate (TESSALON) 100 MG capsule    Sig: Take 1 capsule (100 mg total) by mouth 3  (three) times daily as needed.    Dispense:  30 capsule    Refill:  0    Order Specific Question:   Supervising Provider    Answer:   Sabra Heck, [place]     *If you need refills on other medications prior to your next appointment, please contact your pharmacy*  Follow-Up: Call back or seek an in-person evaluation if the symptoms worsen or if the condition fails to improve as anticipated.  Other Instructions See below   If you have been instructed to have an in-person evaluation today at a local Urgent Care facility, please use the link below. It will take you to a list of all of our available [facility], including address, phone number and hours of operation. Please do not delay care.  [facility]  If you or a family member do not have a primary care provider, use the link below to schedule a visit and establish care. When you choose a [facility] primary care physician or advanced practice provider, you gain a long-term partner in health. Find a Primary Care Provider  Learn more about [facility]'s in-office and virtual care options: [facility]  Sinusitis, Adult Sinusitis is inflammation of your sinuses. Sinuses are hollow spaces in the bones around your face. Your sinuses are  located: Around your eyes. In the middle of your forehead. Behind your nose. In your cheekbones. Mucus normally drains out of your sinuses. When your nasal tissues become inflamed or swollen, mucus can become trapped or blocked. This allows bacteria, viruses, and fungi to grow, which leads to infection. Most infections of thesinuses are caused by a virus. Sinusitis can develop quickly. It can last for up to 4 weeks (acute) or for more than 12 weeks (chronic). Sinusitis often develops after a cold. What are the causes? This condition is caused by anything that creates swelling in the sinuses or stops mucus from draining. This  includes: Allergies. Asthma. Infection from bacteria or viruses. Deformities or blockages in your nose or sinuses. Abnormal growths in the nose (nasal polyps). Pollutants, such as chemicals or irritants in the air. Infection from fungi (rare). What increases the risk? You are more likely to develop this condition if you: Have a weak body defense system (immune system). Do a lot of swimming or diving. Overuse nasal sprays. Smoke. What are the signs or symptoms? The main symptoms of this condition are pain and a feeling of pressure around the affected sinuses. Other symptoms include: Stuffy nose or congestion. Thick drainage from your nose. Swelling and warmth over the affected sinuses. Headache. Upper toothache. A cough that may get worse at night. Extra mucus that collects in the throat or the back of the nose (postnasal drip). Decreased sense of smell and taste. Fatigue. A fever. Sore throat. Bad breath. How is this diagnosed? This condition is diagnosed based on: Your symptoms. Your medical history. A physical exam. Tests to find out if your condition is acute or chronic. This may include: Checking your nose for nasal polyps. Viewing your sinuses using a device that has a light (endoscope). Testing for allergies or bacteria. Imaging tests, such as an MRI or CT scan. In rare cases, a bone biopsy may be done to rule out more serious types offungal sinus disease. How is this treated? Treatment for sinusitis depends on the cause and whether your condition is chronic or acute. If caused by a virus, your symptoms should go away on their own within 10 days. You may be given medicines to relieve symptoms. They include: Medicines that shrink swollen nasal passages (topical intranasal decongestants). Medicines that treat allergies (antihistamines). A spray that eases inflammation of the nostrils (topical intranasal corticosteroids). Rinses that help get rid of thick mucus in your  nose (nasal saline washes). If caused by bacteria, your health care provider may recommend waiting to see if your symptoms improve. Most bacterial infections will get better without antibiotic medicine. You may be given antibiotics if you have: A severe infection. A weak immune system. If caused by narrow nasal passages or nasal polyps, you may need to have surgery. Follow these instructions at home: Medicines Take, use, or apply over-the-counter and prescription medicines only as told by your health care provider. These may include nasal sprays. If you were prescribed an antibiotic medicine, take it as told by your health care provider. Do not stop taking the antibiotic even if you start to feel better. Hydrate and humidify  Drink enough fluid to keep your urine pale yellow. Staying hydrated will help to thin your mucus. Use a cool mist humidifier to keep the humidity level in your home above 50%. Inhale steam for 10-15 minutes, 3-4 times a day, or as told by your health care provider. You can do this in the bathroom while a hot shower is  running. Limit your exposure to cool or dry air.  Rest Rest as much as possible. Sleep with your head raised (elevated). Make sure you get enough sleep each night. General instructions  Apply a warm, moist washcloth to your face 3-4 times a day or as told by your health care provider. This will help with discomfort. Wash your hands often with soap and water to reduce your exposure to germs. If soap and water are not available, use hand sanitizer. Do not smoke. Avoid being around people who are smoking (secondhand smoke). Keep all follow-up visits as told by your health care provider. This is important.  Contact a health care provider if: You have a fever. Your symptoms get worse. Your symptoms do not improve within 10 days. Get help right away if: You have a severe headache. You have persistent vomiting. You have severe pain or swelling around  your face or eyes. You have vision problems. You develop confusion. Your neck is stiff. You have trouble breathing. Summary Sinusitis is soreness and inflammation of your sinuses. Sinuses are hollow spaces in the bones around your face. This condition is caused by nasal tissues that become inflamed or swollen. The swelling traps or blocks the flow of mucus. This allows bacteria, viruses, and fungi to grow, which leads to infection. If you were prescribed an antibiotic medicine, take it as told by your health care provider. Do not stop taking the antibiotic even if you start to feel better. Keep all follow-up visits as told by your health care provider. This is important. This information is not intended to replace advice given to you by your health care provider. Make sure you discuss any questions you have with your healthcare provider. Document Revised: 07/03/2017 Document Reviewed: 07/03/2017 Elsevier Patient Education  2022 [employer].   Acute Bronchitis, Adult  Acute bronchitis is sudden or acute swelling of the air tubes (bronchi) in the lungs. Acute bronchitis causes these tubes to fill with mucus, whichcan make it hard to breathe. It can also cause coughing or wheezing. In adults, acute bronchitis usually goes away within 2 weeks. A cough caused by bronchitis may last up to 3 weeks. Smoking, allergies, and asthma can make thecondition worse. What are the causes? This condition can be caused by germs and by substances that irritate the lungs, including: Cold and flu viruses. The most common cause of this condition is the virus that causes the common cold. Bacteria. Substances that irritate the lungs, including: Smoke from cigarettes and other forms of tobacco. Dust and pollen. Fumes from chemical products, gases, or burned fuel. Other materials that pollute indoor or outdoor air. Close contact with someone who has acute bronchitis. What increases the risk? The following  factors may make you more likely to develop this condition: A weak body's defense system, also called the immune system. A condition that affects your lungs and breathing, such as asthma. What are the signs or symptoms? Common symptoms of this condition include: Lung and breathing problems, such as: Coughing. This may bring up clear, yellow, or green mucus from your lungs (sputum). Wheezing. Having too much mucus in your lungs (chest congestion). Having shortness of breath. A fever. Chills. Aches and pains, including: Tightness in your chest and other body aches. A sore throat. How is this diagnosed? This condition is usually diagnosed based on: Your symptoms and medical history. A physical exam. You may also have other tests, including tests to rule out other conditions, such as pneumonia. These tests include:  A test of lung function. Test of a mucus sample to look for the presence of bacteria. Tests to check the oxygen level in your blood. Blood tests. Chest X-ray. How is this treated? Most cases of acute bronchitis clear up over time without treatment. Your health care provider may recommend: Drinking more fluids. This can thin your mucus, which may improve your breathing. Using a device that gets medicine into your lungs (inhaler) to help improve breathing and control coughing. Using a vaporizer or a humidifier. These are machines that add water to the air to help you breathe better. Taking a medicine for a fever. Taking a medicine that thins mucus and clears congestion (expectorant). Taking a medicine that prevents or stops coughing (cough suppressant). Follow these instructions at home: Activity Get plenty of rest. Return to your normal activities as told by your health care provider. Ask your health care provider what activities are safe for you. Lifestyle  Drink enough fluid to keep your urine pale yellow. Do not drink alcohol. Do not use any products that contain  nicotine or tobacco, such as cigarettes, e-cigarettes, and chewing tobacco. If you need help quitting, ask your health care provider. Be aware that: Your bronchitis will get worse if you smoke or breathe in other people's smoke (secondhand smoke). Your lungs will heal faster if you quit smoking.  General instructions Take over-the-counter and prescription medicines only as told by your health care provider. Use an inhaler, vaporizer, or humidifier as told by your health care provider. If you have a sore throat, gargle with a salt-water mixture 3-4 times a day or as needed. To make a salt-water mixture, completely dissolve -1 tsp (3-6 g) of salt in 1 cup (237 mL) of warm water. Take two teaspoons of honey at bedtime to lessen coughing at night. Keep all follow-up visits as told by your health care provider. This is important. How is this prevented? To lower your risk of getting this condition again: Wash your hands often with soap and water. If soap and water are not available, use hand sanitizer. Avoid contact with people who have cold symptoms. Try not to touch your mouth, nose, or eyes with your hands. Avoid places where there are fumes from chemicals. Breathing these fumes will make your condition worse. Get the flu shot every year. Contact a health care provider if: Your symptoms do not improve after 2 weeks of treatment. You vomit more than once or twice. You have symptoms of dehydration such as: Dark urine. Dry skin or eyes. Increased thirst. Headaches. Confusion. Muscle cramps. Get help right away if you: Cough up blood. Feel pain in your chest. Have severe shortness of breath. Faint or keep feeling like you are going to faint. Have a severe headache. Have fever or chills that get worse. These symptoms may represent a serious problem that is an emergency. Do not wait to see if the symptoms will go away. Get medical help right away. Call your local emergency services (911 in  the [place] Do not drive yourself to the hospital. Summary Acute bronchitis is sudden (acute) inflammation of the air tubes (bronchi) between the windpipe and the lungs. In adults, acute bronchitis usually goes away within 2 weeks, although coughing may last 3 weeks or longer. Take over-the-counter and prescription medicines only as told by your health care provider. Drink enough fluid to keep your urine pale yellow. Contact a health care provider if your symptoms do not improve after 2 weeks of treatment. Get help  right away if you cough up blood, faint, or have chest pain or shortness of breath. This information is not intended to replace advice given to you by your health care provider. Make sure you discuss any questions you have with your healthcare provider. Document Revised: 01/01/2020 Document Reviewed: 08/24/2018 Elsevier Patient Education  [employer] [place].

## 2020-10-26 ENCOUNTER — Other Ambulatory Visit (HOSPITAL_COMMUNITY): Payer: Self-pay

## 2020-10-26 MED FILL — Metoprolol Succinate Tab ER 24HR 100 MG (Tartrate Equiv): ORAL | 90 days supply | Qty: 90 | Fill #1 | Status: AC

## 2020-11-17 DIAGNOSIS — H5213 Myopia, bilateral: Secondary | ICD-10-CM | POA: Diagnosis not present

## 2020-11-24 ENCOUNTER — Other Ambulatory Visit (HOSPITAL_COMMUNITY): Payer: Self-pay

## 2020-11-24 ENCOUNTER — Telehealth: Payer: 59 | Admitting: Physician Assistant

## 2020-11-24 DIAGNOSIS — Z20818 Contact with and (suspected) exposure to other bacterial communicable diseases: Secondary | ICD-10-CM | POA: Diagnosis not present

## 2020-11-24 DIAGNOSIS — J020 Streptococcal pharyngitis: Secondary | ICD-10-CM | POA: Diagnosis not present

## 2020-11-24 MED ORDER — AMOXICILLIN 500 MG PO CAPS
500.0000 mg | ORAL_CAPSULE | Freq: Two times a day (BID) | ORAL | 0 refills | Status: DC
  Filled 2020-11-24: qty 20, 10d supply, fill #0

## 2020-11-24 NOTE — Progress Notes (Signed)

## 2020-11-24 NOTE — Progress Notes (Signed)
I have spent 5 minutes in review of e-visit questionnaire, review and updating patient chart, medical decision making and response to patient.   Wyat Infinger Cody Creston Klas, PA-C    

## 2021-01-27 ENCOUNTER — Other Ambulatory Visit (HOSPITAL_COMMUNITY): Payer: Self-pay

## 2021-01-27 MED FILL — Metoprolol Succinate Tab ER 24HR 100 MG (Tartrate Equiv): ORAL | 90 days supply | Qty: 90 | Fill #2 | Status: AC

## 2021-02-04 ENCOUNTER — Encounter: Payer: Self-pay | Admitting: Internal Medicine

## 2021-02-04 ENCOUNTER — Ambulatory Visit: Payer: 59 | Admitting: Internal Medicine

## 2021-02-04 VITALS — BP 118/80 | HR 71 | Temp 98.1°F | Resp 16 | Ht 64.0 in | Wt 164.1 lb

## 2021-02-04 DIAGNOSIS — R202 Paresthesia of skin: Secondary | ICD-10-CM | POA: Diagnosis not present

## 2021-02-04 LAB — COMPREHENSIVE METABOLIC PANEL
ALT: 56 U/L — ABNORMAL HIGH (ref 0–35)
AST: 34 U/L (ref 0–37)
Albumin: 4.7 g/dL (ref 3.5–5.2)
Alkaline Phosphatase: 59 U/L (ref 39–117)
BUN: 12 mg/dL (ref 6–23)
CO2: 29 mEq/L (ref 19–32)
Calcium: 9.6 mg/dL (ref 8.4–10.5)
Chloride: 104 mEq/L (ref 96–112)
Creatinine, Ser: 0.7 mg/dL (ref 0.40–1.20)
GFR: 110.57 mL/min (ref >60.00)
Glucose, Bld: 86 mg/dL (ref 70–99)
Potassium: 3.8 mEq/L (ref 3.5–5.1)
Sodium: 139 mEq/L (ref 135–145)
Total Bilirubin: 0.8 mg/dL (ref 0.2–1.2)
Total Protein: 7 g/dL (ref 6.0–8.3)

## 2021-02-04 LAB — CBC WITH DIFFERENTIAL/PLATELET
Basophils Absolute: 0 10*3/uL (ref 0.0–0.1)
Basophils Relative: 0.8 % (ref 0.0–3.0)
Eosinophils Absolute: 0.2 10*3/uL (ref 0.0–0.7)
Eosinophils Relative: 3.8 % (ref 0.0–5.0)
HCT: 39.2 % (ref 36.0–46.0)
Hemoglobin: 13.5 g/dL (ref 12.0–15.0)
Lymphocytes Relative: 28.6 % (ref 12.0–46.0)
Lymphs Abs: 1.5 10*3/uL (ref 0.7–4.0)
MCHC: 34.4 g/dL (ref 30.0–36.0)
MCV: 90.8 fl (ref 78.0–100.0)
Monocytes Absolute: 0.3 10*3/uL (ref 0.1–1.0)
Monocytes Relative: 5.7 % (ref 3.0–12.0)
Neutro Abs: 3.2 10*3/uL (ref 1.4–7.7)
Neutrophils Relative %: 61.1 % (ref 43.0–77.0)
Platelets: 245 10*3/uL (ref 150.0–400.0)
RBC: 4.32 Mil/uL (ref 3.87–5.11)
RDW: 12 % (ref 11.5–15.5)
WBC: 5.3 10*3/uL (ref 4.0–10.5)

## 2021-02-04 LAB — SEDIMENTATION RATE: Sed Rate: 1 mm/hr (ref 0–20)

## 2021-02-04 LAB — HEMOGLOBIN A1C: Hgb A1c MFr Bld: 4.8 % (ref 4.6–6.5)

## 2021-02-04 LAB — B12 AND FOLATE PANEL
Folate: >24.2 ng/mL (ref >5.9)
Vitamin B-12: 918 pg/mL — ABNORMAL HIGH (ref 211–911)

## 2021-02-04 LAB — TSH: TSH: 1.06 u[IU]/mL (ref 0.35–5.50)

## 2021-02-04 LAB — VITAMIN D 25 HYDROXY (VIT D DEFICIENCY, FRACTURES): VITD: 31.67 ng/mL (ref 30.00–100.00)

## 2021-02-04 NOTE — Progress Notes (Signed)
Subjective:    Patient ID: Tanya Hicks, female    DOB: 1985/03/05, 35 y.o.   MRN: [id number]  DOS:  02/04/2021 Type of visit - description: Acute  About a month ago developed paresthesias at the R big toe described as a tingling more than a numbness.  No pain. Gradually, symptoms become bilaterally in the lower extremities in a sock distribution. Later on had similar symptoms in a glove distribution in both upper extremities.  Symptoms are not severe, in the last few days they have been a little better.  She denies fever chills No recent GI infection No neck or back pain. Several months ago had episodes of bilateral blurred vision that lasted minutes.  Had a normal eye check in October. Denies headache, dizziness, diplopia, slurred speech, motor deficits.  Review of Systems See above   Past Medical History:  Diagnosis Date   Hypertension    Kidney stone 01-2014   Ovarian cyst    Pregnancy induced hypertension    PROM (premature rupture of membranes) 08/13/2014   SVD (spontaneous vaginal delivery) 08/14/2014    Past Surgical History:  Procedure Laterality Date   WISDOM TOOTH EXTRACTION      Allergies as of 02/04/2021   No Known Allergies      Medication List        Accurate as of February 04, 2021 10:19 AM. If you have any questions, ask your nurse or doctor.          amoxicillin 500 MG capsule Commonly known as: AMOXIL Take 1 capsule (500 mg total) by mouth 2 (two) times daily.   benzonatate 100 MG capsule Commonly known as: TESSALON Take 1 capsule (100 mg total) by mouth 3 (three) times daily as needed.   metoprolol succinate 100 MG 24 hr tablet Commonly known as: TOPROL-XL TAKE 1 TABLET (100 MG TOTAL) BY MOUTH DAILY. WITH OR IMMEDIATELY FOLLOWING A MEAL.   phenazopyridine 100 MG tablet Commonly known as: PYRIDIUM TAKE 1 TABLET BY MOUTH 3 TIMES DAILY AS NEEDED   predniSONE 10 MG (21) Tbpk tablet Commonly known as: STERAPRED UNI-PAK 21 TAB 6 day  taper; take as directed on package instructions   triamcinolone ointment 0.5 % Commonly known as: KENALOG Apply 1 application topically 2 (two) times daily.           Objective:   Physical Exam BP 118/80 (BP Location: Left Arm, Patient Position: Sitting, Cuff Size: Small)    Pulse 71    Temp 98.1 F (36.7 C) (Oral)    Resp 16    Ht 5\' 4"  (1.626 m)    Wt 164 lb 2 oz (74.4 kg)    LMP 01/14/2021 (Approximate)    SpO2 98%    BMI 28.17 kg/m  General:   Well developed, NAD, BMI noted. HEENT:  Normocephalic . Face symmetric, atraumatic.  EOMI, pupils equal and reactive Neck: No thyromegaly Lungs:  CTA B Normal respiratory effort, no intercostal retractions, no accessory muscle use. Heart: RRR,  no murmur.  Lower extremities: no pretibial edema bilaterally  Skin: Not pale. Not jaundice Neurologic:  alert & oriented X3.  Speech normal, gait appropriate for age and unassisted. DTRs and motor symmetric Pinprick examination of the upper and lower extremities normal.  Proprioception normal (toes) Psych--  Cognition and judgment appear intact.  Cooperative with normal attention span and concentration.  Behavior appropriate. No anxious or depressed appearing.      Assessment     Assessment HTN Ovarian cyst Kidney  stone 01/2014 Birth control: Husband had a vasectomy  CP: Stress test normal 05/2017 Shingles: L leg 10/2019    Plan: Paresthesias, neuropathy: Symptoms as described above.  Neurology exam is essentially normal. We agreed on the following: Labs:CMP, CBC TSH, O27, folic acid, homocystine level, vitamin D, A1c , ANA sed rate spep, HIV, hep C If symptoms persist: She will let me know for a neurology referral. If she has on and off symptoms in the next 3 months, she also needs further evaluation by neurology. 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  This visit occurred during the SARS-CoV-2  public health emergency.  Safety protocols were in place, including screening questions prior to the visit, additional usage of staff PPE, and extensive cleaning of exam room while observing appropriate contact time as indicated for disinfecting solutions.

## 2021-02-04 NOTE — Patient Instructions (Signed)
If your symptoms continue or get worse please let me know.  If you have episodic symptoms such as dizziness, blurred vision, paresthesias on and off in the next few months: Please let me know   GO TO THE LAB : Get the blood work

## 2021-02-05 NOTE — Assessment & Plan Note (Signed)
Paresthesias, neuropathy: Symptoms as described above.  Neurology exam is essentially normal. We agreed on the following: Labs:CMP, CBC TSH, I09, folic acid, homocystine level, vitamin D, A1c , ANA sed rate spep, HIV, hep C If symptoms persist: She will let me know for a neurology referral. If she has on and off symptoms in the next 3 months, she also needs further evaluation by neurology. 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

## 2021-02-09 LAB — HEPATITIS C ANTIBODY
Hepatitis C Ab: NONREACTIVE
SIGNAL TO CUT-OFF: 0.03 (ref <1.00)

## 2021-02-09 LAB — PROTEIN ELECTROPHORESIS, SERUM
Albumin ELP: 4.5 g/dL (ref 3.8–4.8)
Alpha 1: 0.3 g/dL (ref 0.2–0.3)
Alpha 2: 0.6 g/dL (ref 0.5–0.9)
Beta 2: 0.3 g/dL (ref 0.2–0.5)
Beta Globulin: 0.4 g/dL (ref 0.4–0.6)
Gamma Globulin: 0.8 g/dL (ref 0.8–1.7)
Total Protein: 6.9 g/dL (ref 6.1–8.1)

## 2021-02-09 LAB — HIV ANTIBODY (ROUTINE TESTING W REFLEX): HIV 1&2 Ab, 4th Generation: NONREACTIVE

## 2021-02-09 LAB — HOMOCYSTEINE: Homocysteine: 7.3 umol/L (ref <10.4)

## 2021-02-09 LAB — ANA: Anti Nuclear Antibody (ANA): NEGATIVE

## 2021-04-16 ENCOUNTER — Encounter: Payer: 59 | Admitting: Internal Medicine

## 2021-04-28 ENCOUNTER — Other Ambulatory Visit: Payer: Self-pay | Admitting: Internal Medicine

## 2021-04-28 ENCOUNTER — Other Ambulatory Visit (HOSPITAL_COMMUNITY): Payer: Self-pay

## 2021-04-28 MED ORDER — METOPROLOL SUCCINATE ER 100 MG PO TB24
100.0000 mg | ORAL_TABLET | Freq: Every day | ORAL | 1 refills | Status: DC
  Filled 2021-04-28: qty 90, 90d supply, fill #0
  Filled 2021-07-23: qty 90, 90d supply, fill #1

## 2021-05-03 DIAGNOSIS — Z01419 Encounter for gynecological examination (general) (routine) without abnormal findings: Secondary | ICD-10-CM | POA: Diagnosis not present

## 2021-05-03 DIAGNOSIS — Z130 Encounter for screening for diseases of the blood and blood-forming organs and certain disorders involving the immune mechanism: Secondary | ICD-10-CM | POA: Diagnosis not present

## 2021-05-03 DIAGNOSIS — Z1389 Encounter for screening for other disorder: Secondary | ICD-10-CM | POA: Diagnosis not present

## 2021-05-03 DIAGNOSIS — Z6828 Body mass index (BMI) 28.0-28.9, adult: Secondary | ICD-10-CM | POA: Diagnosis not present

## 2021-05-14 ENCOUNTER — Encounter: Payer: Self-pay | Admitting: Internal Medicine

## 2021-05-14 ENCOUNTER — Ambulatory Visit (INDEPENDENT_AMBULATORY_CARE_PROVIDER_SITE_OTHER): Payer: 59 | Admitting: Internal Medicine

## 2021-05-14 VITALS — BP 130/80 | HR 65 | Resp 20 | Ht 64.0 in | Wt 161.3 lb

## 2021-05-14 DIAGNOSIS — Z0000 Encounter for general adult medical examination without abnormal findings: Secondary | ICD-10-CM | POA: Diagnosis not present

## 2021-05-14 NOTE — Progress Notes (Signed)
? ?Subjective:  ? ? Patient ID: Tanya Hicks, female    DOB: 06-Oct-1985, 36 y.o.   MRN: [id number] ? ?DOS:  05/14/2021 ?Type of visit - description: cpx ? ?Here for CPX ?Since the last office visit he is doing well, reports on and off extremity paresthesias. ?Denies visual disturbances, nausea or vomiting.  Occasionally has a mild headache. ? ?Review of Systems ? ?Other than above, a 14 point review of systems is negative  ? ?  ? ?Past Medical History:  ?Diagnosis Date  ? Hypertension   ? Kidney stone 01-2014  ? Ovarian cyst   ? Pregnancy induced hypertension   ? PROM (premature rupture of membranes) 08/13/2014  ? SVD (spontaneous vaginal delivery) 08/14/2014  ? ? ?Past Surgical History:  ?Procedure Laterality Date  ? WISDOM TOOTH EXTRACTION    ? ?Social History  ? ?Socioeconomic History  ? Marital status: Married  ?  Spouse name: Not on file  ? Number of children: 2  ? Years of education: Not on file  ? Highest education level: Not on file  ?Occupational History  ? Occupation: Equities trader   ?  Employer: [employer]  ?Tobacco Use  ? Smoking status: Never  ? Smokeless tobacco: Never  ?Substance and Sexual Activity  ? Alcohol use: No  ?  Comment: socially  ? Drug use: No  ? Sexual activity: Yes  ?Other Topics Concern  ? Not on file  ?Social History Narrative  ? RN, married to Richey  ? 2 children ---->  2014, 2016  ? ?Social Determinants of Health  ? ?Financial Resource Strain: Not on file  ?Food Insecurity: Not on file  ?Transportation Needs: Not on file  ?Physical Activity: Not on file  ?Stress: Not on file  ?Social Connections: Not on file  ?Intimate Partner Violence: Not on file  ? ? ? ?Current Outpatient Medications  ?Medication Instructions  ? metoprolol succinate (TOPROL-XL) 100 MG 24 hr tablet Take 1 tablet (100 mg total) by mouth daily with or immediately following a meal.  ? triamcinolone ointment (KENALOG) 0.5 % 1 application., Topical, 2 times daily  ? ? ?   ?Objective:  ? Physical Exam ?BP 130/80 (BP  Location: Left Arm, Patient Position: Sitting, Cuff Size: Normal)   Pulse 65   Resp 20   Ht '5\' 4"'$  (1.626 m)   Wt 161 lb 4.8 oz (73.2 kg)   SpO2 99%   BMI 27.69 kg/m?  ?General: ?Well developed, NAD, BMI noted ?Neck: No  thyromegaly  ?HEENT:  ?Normocephalic . Face symmetric, atraumatic ?Lungs:  ?CTA B ?Normal respiratory effort, no intercostal retractions, no accessory muscle use. ?Heart: RRR,  no murmur.  ?Abdomen:  ?Not distended, soft, non-tender. No rebound or rigidity.   ?Lower extremities: no pretibial edema bilaterally  ?Skin: Exposed areas without rash. Not pale. Not jaundice ?Neurologic:  ?alert & oriented X3.  ?Speech normal, gait appropriate for age and unassisted ?Strength symmetric and appropriate for age.  ?Psych: ?Cognition and judgment appear intact.  ?Cooperative with normal attention span and concentration.  ?Behavior appropriate. ?No anxious or depressed appearing. ? ?   ?Assessment   ? ? Assessment ?HTN ?Ovarian cyst ?Kidney stone 01/2014 ?Birth control: Husband had a vasectomy  ?CP: Stress test normal 05/2017 ?Shingles: L leg 10/2019  ? ?PLAN ?Here for CPX ?HTN: Good med compliance, ambulatory BPs 120s, 130s/80s.  Continue metoprolol. ?Paresthesias, neuropathy: see LOV, sxs   on and off, see last visit.  Blood work was essentially normal.  Currently with no problems but will call if interested in further evaluation by neurology. ?Thyroid nodule: The patient felt to have thyromegaly last year, US show a single thyroid nodule.  Did not meet criteria for follow-up but we agreed to revisit the issue next year and possibly order a f/u ultrasound. ?RTC 1 year. ? ? ?This visit occurred during the SARS-CoV-2 public health emergency.  Safety protocols were in place, including screening questions prior to the visit, additional usage of staff PPE, and extensive cleaning of exam room while observing appropriate contact time as indicated for disinfecting solutions.  ? ?

## 2021-05-14 NOTE — Patient Instructions (Signed)
Check the  blood pressure regularly °BP GOAL is between 110/65 and  135/85. °If it is consistently higher or lower, let me know °  ° °GO TO THE LAB : Get the blood work   ° ° °GO TO THE FRONT DESK, PLEASE SCHEDULE YOUR APPOINTMENTS °Come back for   a physical exam in 1 year °

## 2021-05-15 ENCOUNTER — Encounter: Payer: Self-pay | Admitting: Internal Medicine

## 2021-05-15 LAB — ALT: ALT: 27 U/L (ref 6–29)

## 2021-05-15 LAB — AST: AST: 22 U/L (ref 10–30)

## 2021-05-15 LAB — LIPID PANEL
Cholesterol: 186 mg/dL (ref <200)
HDL: 63 mg/dL (ref >=50)
LDL Cholesterol (Calc): 102 mg/dL (calc) — ABNORMAL HIGH
Non-HDL Cholesterol (Calc): 123 mg/dL (calc) (ref <130)
Total CHOL/HDL Ratio: 3 (calc) (ref <5.0)
Triglycerides: 115 mg/dL (ref <150)

## 2021-05-15 NOTE — Assessment & Plan Note (Signed)
Here for CPX ?HTN: Good med compliance, ambulatory BPs 120s, 130s/80s.  Continue metoprolol. ?Paresthesias, neuropathy: see LOV, sxs   on and off, see last visit.  Blood work was essentially normal.  Currently with no problems but will call if interested in further evaluation by neurology. ?Thyroid nodule: The patient felt to have thyromegaly last year, US show a single thyroid nodule.  Did not meet criteria for follow-up but we agreed to revisit the issue next year and possibly order a f/u ultrasound. ?RTC 1 year. ?

## 2021-05-15 NOTE — Assessment & Plan Note (Signed)
-   Td 2016 ?- covid vax x2, booster is an option   ?- flu shot at work ?-Gyn care per gyn ?- Diet and exercise: Doing very well with lifestyle. ?-Labs reviewed, last LFTs slightly elevated, takes Tylenol and drinks alcohol infrequently.  Recheck LFTs and FLP. ? ?  ? ? ?

## 2021-07-23 ENCOUNTER — Other Ambulatory Visit: Payer: Self-pay | Admitting: Family

## 2021-07-23 ENCOUNTER — Other Ambulatory Visit (HOSPITAL_COMMUNITY): Payer: Self-pay

## 2021-07-23 DIAGNOSIS — L301 Dyshidrosis [pompholyx]: Secondary | ICD-10-CM

## 2021-07-26 ENCOUNTER — Other Ambulatory Visit (HOSPITAL_COMMUNITY): Payer: Self-pay

## 2021-07-26 ENCOUNTER — Other Ambulatory Visit: Payer: Self-pay | Admitting: Internal Medicine

## 2021-07-26 DIAGNOSIS — L301 Dyshidrosis [pompholyx]: Secondary | ICD-10-CM

## 2021-07-26 MED ORDER — TRIAMCINOLONE ACETONIDE 0.5 % EX OINT
1.0000 "application " | TOPICAL_OINTMENT | Freq: Two times a day (BID) | CUTANEOUS | 1 refills | Status: AC
  Filled 2021-07-26: qty 60, 30d supply, fill #0

## 2021-09-16 ENCOUNTER — Telehealth: Payer: 59 | Admitting: Physician Assistant

## 2021-09-16 DIAGNOSIS — B001 Herpesviral vesicular dermatitis: Secondary | ICD-10-CM | POA: Diagnosis not present

## 2021-09-17 ENCOUNTER — Other Ambulatory Visit (HOSPITAL_COMMUNITY): Payer: Self-pay

## 2021-09-17 MED ORDER — VALACYCLOVIR HCL 1 G PO TABS
2000.0000 mg | ORAL_TABLET | Freq: Two times a day (BID) | ORAL | 0 refills | Status: AC
Start: 2021-09-17 — End: 2021-09-18
  Filled 2021-09-17: qty 4, 1d supply, fill #0

## 2021-09-17 NOTE — Progress Notes (Signed)

## 2021-10-08 ENCOUNTER — Telehealth: Payer: 59 | Admitting: Family Medicine

## 2021-10-08 ENCOUNTER — Other Ambulatory Visit (HOSPITAL_COMMUNITY): Payer: Self-pay

## 2021-10-08 DIAGNOSIS — L309 Dermatitis, unspecified: Secondary | ICD-10-CM | POA: Diagnosis not present

## 2021-10-08 DIAGNOSIS — R21 Rash and other nonspecific skin eruption: Secondary | ICD-10-CM

## 2021-10-08 MED ORDER — PREDNISONE 10 MG (21) PO TBPK
ORAL_TABLET | Freq: Every day | ORAL | 0 refills | Status: AC
  Filled 2021-10-08: qty 21, 6d supply, fill #0

## 2021-10-08 MED ORDER — CEPHALEXIN 500 MG PO CAPS
500.0000 mg | ORAL_CAPSULE | Freq: Three times a day (TID) | ORAL | 0 refills | Status: AC
  Filled 2021-10-08: qty 21, 7d supply, fill #0

## 2021-10-08 NOTE — Patient Instructions (Signed)
Atopic Dermatitis ?Atopic dermatitis is a skin disorder that causes inflammation of the skin. It is marked by a red rash and itchy, dry, scaly skin. It is the most common type of eczema. Eczema is a group of skin conditions that cause the skin to become rough and swollen. This condition is generally worse during the cooler winter months and often improves during the warm summer months. ?Atopic dermatitis usually starts showing signs in infancy and can last through adulthood. This condition cannot be passed from one person to another (is not contagious). Atopic dermatitis may not always be present, but when it is, it is called a flare-up. ?What are the causes? ?The exact cause of this condition is not known. Flare-ups may be triggered by: ?Coming in contact with something that you are sensitive or allergic to (allergen). ?Stress. ?Certain foods. ?Extremely hot or cold weather. ?Harsh chemicals and soaps. ?Dry air. ?Chlorine. ?What increases the risk? ?This condition is more likely to develop in people who have a personal or family history of: ?Eczema. ?Allergies. ?Asthma. ?Hay fever. ?What are the signs or symptoms? ?Symptoms of this condition include: ?Dry, scaly skin. ?Red, itchy rash. ?Itchiness, which can be severe. This may occur before the skin rash. This can make sleeping difficult. ?Skin thickening and cracking that can occur over time. ?How is this diagnosed? ?This condition is diagnosed based on: ?Your symptoms. ?Your medical history. ?A physical exam. ?How is this treated? ?There is no cure for this condition, but symptoms can usually be controlled. Treatment focuses on: ?Controlling the itchiness and scratching. You may be given medicines, such as antihistamines or steroid creams. ?Limiting exposure to allergens. ?Recognizing situations that cause stress and developing a plan to manage stress. ?If your atopic dermatitis does not get better with medicines, or if it is all over your body (widespread), a  treatment using a specific type of light (phototherapy) may be used. ?Follow these instructions at home: ?Skin care ? ?Keep your skin well moisturized. Doing this seals in moisture and helps to prevent dryness. ?Use unscented lotions that have petroleum in them. ?Avoid lotions that contain alcohol or water. They can dry the skin. ?Keep baths or showers short (less than 5 minutes) in warm water. Do not use hot water. ?Use mild, unscented cleansers for bathing. Avoid soap and bubble bath. ?Apply a moisturizer to your skin right after a bath or shower. ?Do not apply anything to your skin without checking with your health care provider. ?General instructions ?Take or apply over-the-counter and prescription medicines only as told by your health care provider. ?Dress in clothes made of cotton or cotton blends. Dress lightly because heat increases itchiness. ?When washing your clothes, rinse your clothes twice so all of the soap is removed. ?Avoid any triggers that can cause a flare-up. ?Keep your fingernails cut short. ?Avoid scratching. Scratching makes the rash and itchiness worse. A break in the skin from scratching could result in a skin infection (impetigo). ?Do not be around people who have cold sores or fever blisters. If you get the infection, it may cause your atopic dermatitis to worsen. ?Keep all follow-up visits. This is important. ?Contact a health care provider if: ?Your itchiness interferes with sleep. ?Your rash gets worse or is not better within one week of starting treatment. ?You have a fever. ?You have a rash flare-up after having contact with someone who has cold sores or fever blisters. ?Get help right away if: ?You develop pus or soft yellow scabs in the rash   area. ?Summary ?Atopic dermatitis causes a red rash and itchy, dry, scaly skin. ?Treatment focuses on controlling the itchiness and scratching, limiting exposure to things that you are sensitive or allergic to (allergens), recognizing  situations that cause stress, and developing a plan to manage stress. ?Keep your skin well moisturized. ?Keep baths or showers shorter than 5 minutes and use warm water. Do not use hot water. ?This information is not intended to replace advice given to you by your health care provider. Make sure you discuss any questions you have with your health care provider. ?Document Revised: 11/11/2019 Document Reviewed: 11/11/2019 ?Elsevier Patient Education ? 2023 Elsevier Inc. ? ?

## 2021-10-08 NOTE — Progress Notes (Signed)
   Thank you for the details you included in the comment boxes. Those details are very helpful in determining the best course of treatment for you and help [place] to provide the best care.Because Ms. Tanya Hicks, we recommend that you convert this visit to a video visit in order for the provider to better assess what is going on.  The provider will be able to give you a more accurate diagnosis and treatment plan if we can more freely discuss your symptoms and with the addition of a virtual examination.   If you convert to a video visit, we will bill your insurance (similar to an office visit) and you will not be charged for this e-Visit. You will be able to stay at home and speak with the first available [facility] advanced practice provider. The link to do a video visit is in the drop down Menu tab of your Welcome screen in [place].

## 2021-10-08 NOTE — Progress Notes (Signed)
Virtual Visit Consent   Tanya Hicks, you are scheduled for a virtual visit with a [place] provider today. Just as with appointments in the office, your consent must be obtained to participate. Your consent will be active for this visit and any virtual visit you may have with one of our providers in the next 365 days. If you have a MyChart account, a copy of this consent can be sent to you electronically.  As this is a virtual visit, video technology does not allow for your provider to perform a traditional examination. This may limit your provider's ability to fully assess your condition. If your provider identifies any concerns that need to be evaluated in person or the need to arrange testing (such as labs, EKG, etc.), we will make arrangements to do so. Although advances in technology are sophisticated, we cannot ensure that it will always work on either your end or our end. If the connection with a video visit is poor, the visit may have to be switched to a telephone visit. With either a video or telephone visit, we are not always able to ensure that we have a secure connection.  By engaging in this virtual visit, you consent to the provision of healthcare and authorize for your insurance to be billed (if applicable) for the services provided during this visit. Depending on your insurance coverage, you may receive a charge related to this service.  I need to obtain your verbal consent now. Are you willing to proceed with your visit today? KENEDY HAISLEY has provided verbal consent on 10/08/2021 for a virtual visit (video or telephone). Dellia Nims, FNP  Date: 10/08/2021 12:06 PM  Virtual Visit via Video Note   I, Dellia Nims, connected with  Tanya Hicks  ([id number], 04-May-1985) on 10/08/21 at 12:00 PM EDT by a video-enabled telemedicine application and verified that I am speaking with the correct person using two identifiers.  Location: Patient: Virtual Visit Location Patient:  Home Provider: Virtual Visit Location Provider: Home Office   I discussed the limitations of evaluation and management by telemedicine and the availability of in person appointments. The patient expressed understanding and agreed to proceed.    History of Present Illness: Tanya Hicks is a 36 y.o. who identifies as a female who was assigned female at birth, and is being seen today for rash around mouth and nose not responding to acyclovir and triamcinolone. Red, itching and dry. Small bumps. Warm to touch. No fever.   HPI: HPI  Problems:  Patient Active Problem List   Diagnosis Date Noted   Pelvic pain 11/10/2016   PCP NOTES >>>>> 01/14/2015   SVD (spontaneous vaginal delivery) 08/14/2014   PROM (premature rupture of membranes) 08/13/2014   Annual physical exam 11/16/2012   HYPERTENSION 12/29/2006    Allergies: No Known Allergies Medications:  Current Outpatient Medications:    cephALEXin (KEFLEX) 500 MG capsule, Take 1 capsule (500 mg total) by mouth 3 (three) times daily for 7 days., Disp: 21 capsule, Rfl: 0   predniSONE (STERAPRED UNI-PAK 21 TAB) 10 MG (21) TBPK tablet, Take by mouth daily for 6 days. 10 mg - 6 day dose pack as directed on package  directions., Disp: 1 each, Rfl: 0   metoprolol succinate (TOPROL-XL) 100 MG 24 hr tablet, Take 1 tablet (100 mg total) by mouth daily with or immediately following a meal., Disp: 90 tablet, Rfl: 1   triamcinolone ointment (KENALOG) 0.5 %, Apply 1 application topically 2 (two) times  daily., Disp: 60 g, Rfl: 1  Observations/Objective: Patient is well-developed, well-nourished in no acute distress.  Resting comfortably  at home.  Head is normocephalic, atraumatic.  No labored breathing.  Speech is clear and coherent with logical content.  Patient is alert and oriented at baseline.    Assessment and Plan: 1. Dermatitis  Keep clean and dry, apply vaseline, keep apptmt with derm as planned.   Follow Up Instructions: I discussed the  assessment and treatment plan with the patient. The patient was provided an opportunity to ask questions and all were answered. The patient agreed with the plan and demonstrated an understanding of the instructions.  A copy of instructions were sent to the patient via MyChart unless otherwise noted below.     The patient was advised to call back or seek an in-person evaluation if the symptoms worsen or if the condition fails to improve as anticipated.  Time:  I spent 10 minutes with the patient via telehealth technology discussing the above problems/concerns.    Dellia Nims, FNP

## 2021-10-19 ENCOUNTER — Other Ambulatory Visit: Payer: Self-pay | Admitting: Internal Medicine

## 2021-10-19 ENCOUNTER — Other Ambulatory Visit (HOSPITAL_COMMUNITY): Payer: Self-pay

## 2021-10-19 MED ORDER — METOPROLOL SUCCINATE ER 100 MG PO TB24
100.0000 mg | ORAL_TABLET | Freq: Every day | ORAL | 1 refills | Status: DC
  Filled 2021-10-19: qty 90, 90d supply, fill #0
  Filled 2022-01-18: qty 90, 90d supply, fill #1

## 2021-10-22 ENCOUNTER — Other Ambulatory Visit (HOSPITAL_COMMUNITY): Payer: Self-pay

## 2021-10-22 DIAGNOSIS — L710 Perioral dermatitis: Secondary | ICD-10-CM | POA: Diagnosis not present

## 2021-10-22 DIAGNOSIS — L301 Dyshidrosis [pompholyx]: Secondary | ICD-10-CM | POA: Diagnosis not present

## 2021-10-22 MED ORDER — METRONIDAZOLE 0.75 % EX CREA
1.0000 | TOPICAL_CREAM | Freq: Two times a day (BID) | CUTANEOUS | 11 refills | Status: AC
  Filled 2021-10-22: qty 45, 30d supply, fill #0
  Filled 2022-01-18: qty 45, 30d supply, fill #1

## 2021-11-18 DIAGNOSIS — H5213 Myopia, bilateral: Secondary | ICD-10-CM | POA: Diagnosis not present

## 2022-01-18 ENCOUNTER — Other Ambulatory Visit (HOSPITAL_COMMUNITY): Payer: Self-pay

## 2022-03-21 ENCOUNTER — Other Ambulatory Visit (HOSPITAL_COMMUNITY): Payer: Self-pay

## 2022-03-21 DIAGNOSIS — L564 Polymorphous light eruption: Secondary | ICD-10-CM | POA: Diagnosis not present

## 2022-03-21 DIAGNOSIS — L918 Other hypertrophic disorders of the skin: Secondary | ICD-10-CM | POA: Diagnosis not present

## 2022-03-21 DIAGNOSIS — R52 Pain, unspecified: Secondary | ICD-10-CM | POA: Diagnosis not present

## 2022-03-21 DIAGNOSIS — D225 Melanocytic nevi of trunk: Secondary | ICD-10-CM | POA: Diagnosis not present

## 2022-03-21 DIAGNOSIS — D224 Melanocytic nevi of scalp and neck: Secondary | ICD-10-CM | POA: Diagnosis not present

## 2022-03-21 MED ORDER — BETAMETHASONE DIPROPIONATE AUG 0.05 % EX OINT
1.0000 | TOPICAL_OINTMENT | Freq: Two times a day (BID) | CUTANEOUS | 0 refills | Status: AC
  Filled 2022-03-21: qty 50, 25d supply, fill #0

## 2022-03-22 ENCOUNTER — Other Ambulatory Visit (HOSPITAL_COMMUNITY): Payer: Self-pay

## 2022-04-25 ENCOUNTER — Other Ambulatory Visit: Payer: Self-pay | Admitting: Internal Medicine

## 2022-04-25 ENCOUNTER — Other Ambulatory Visit (HOSPITAL_COMMUNITY): Payer: Self-pay

## 2022-04-25 MED ORDER — METOPROLOL SUCCINATE ER 100 MG PO TB24
100.0000 mg | ORAL_TABLET | Freq: Every day | ORAL | 1 refills | Status: DC
  Filled 2022-04-25: qty 90, 90d supply, fill #0
  Filled 2022-07-22: qty 90, 90d supply, fill #1

## 2022-06-08 ENCOUNTER — Encounter: Payer: Commercial Managed Care - PPO | Admitting: Internal Medicine

## 2022-07-15 ENCOUNTER — Encounter: Payer: Self-pay | Admitting: Internal Medicine

## 2022-07-15 ENCOUNTER — Ambulatory Visit (INDEPENDENT_AMBULATORY_CARE_PROVIDER_SITE_OTHER): Payer: 59 | Admitting: Internal Medicine

## 2022-07-15 VITALS — BP 128/74 | HR 66 | Temp 98.0°F | Resp 16 | Ht 63.0 in | Wt 161.0 lb

## 2022-07-15 DIAGNOSIS — I159 Secondary hypertension, unspecified: Secondary | ICD-10-CM

## 2022-07-15 DIAGNOSIS — Z0000 Encounter for general adult medical examination without abnormal findings: Secondary | ICD-10-CM | POA: Diagnosis not present

## 2022-07-15 LAB — CBC WITH DIFFERENTIAL/PLATELET
Eosinophils Absolute: 462 cells/uL (ref 15–500)
MCHC: 34.8 g/dL (ref 32.0–36.0)
MCV: 90.7 fL (ref 80.0–100.0)
Monocytes Relative: 4.6 %
WBC: 6.5 10*3/uL (ref 3.8–10.8)

## 2022-07-15 NOTE — Assessment & Plan Note (Signed)
-  Td 2016 - covid vax x2, booster is an option   - flu shot at work -Gyn care per gyn -Encouraged to increase physical activity, doing okay with diet. -Labs: CMP FLP CBC A1c TSH

## 2022-07-15 NOTE — Patient Instructions (Signed)
Vaccines to consider: COVID booster Flu shot every fall  Continue checking your blood pressure BP GOAL is between 110/65 and  135/85. If it is consistently higher or lower, let me know     GO TO THE LAB : Get the blood work     GO TO THE FRONT DESK, PLEASE SCHEDULE YOUR APPOINTMENTS Come back for   a physical in 1 year

## 2022-07-15 NOTE — Assessment & Plan Note (Signed)
Here for CPX HTN: On metoprolol, ambulatory BPs in the 130/80.  Very rarely has a diastolic BP of 90.  No change.  Labs. Thyroid nodule: On clinical exam I was unable to find a nodule.  Ultrasound was 2022, offered a follow-up but we agreed to simply reassess next year.  Checking labs. RTC 1 year

## 2022-07-15 NOTE — Progress Notes (Signed)
Subjective:    Patient ID: Tanya Hicks, female    DOB: Apr 10, 1985, 37 y.o.   MRN: [id number]  DOS:  07/15/2022 Type of visit - description: cpx  Since the last office visit is doing well. Has no major concerns.   Review of Systems  A 14 point review of systems is negative    Past Medical History:  Diagnosis Date   Hypertension    Kidney stone 01-2014   Ovarian cyst    Pregnancy induced hypertension    PROM (premature rupture of membranes) 08/13/2014   SVD (spontaneous vaginal delivery) 08/14/2014    Past Surgical History:  Procedure Laterality Date   WISDOM TOOTH EXTRACTION     Social History   Socioeconomic History   Marital status: Married    Spouse name: Not on file   Number of children: 2   Years of education: Not on file   Highest education level: Not on file  Occupational History   Occupation: Education officer, museum     Employer: [employer]  Tobacco Use   Smoking status: Never   Smokeless tobacco: Never  Substance and Sexual Activity   Alcohol use: No    Comment: socially   Drug use: No   Sexual activity: Yes  Other Topics Concern   Not on file  Social History Narrative   RN, married to [place]   2 children ---->  2014, 2016   Social Determinants of Health   Financial Resource Strain: Not on file  Food Insecurity: Not on file  Transportation Needs: Not on file  Physical Activity: Not on file  Stress: Not on file  Social Connections: Not on file  Intimate Partner Violence: Not on file    Current Outpatient Medications  Medication Instructions   augmented betamethasone dipropionate (DIPROLENE-AF) 0.05 % ointment Apply as directed to affected area twice a day   metoprolol succinate (TOPROL-XL) 100 mg, Oral, Daily   metroNIDAZOLE (METROCREAM) 0.75 % cream Apply 1 application (a small amount) topically to affected area 2 (two) times daily.   triamcinolone ointment (KENALOG) 0.5 % Apply 1 application topically 2 (two) times daily.       Objective:    Physical Exam BP 128/74 (BP Location: Right Arm, Patient Position: Sitting, Cuff Size: Normal)   Pulse 66   Temp 98 F (36.7 C) (Oral)   Resp 16   Ht 5\' 3"  (1.6 m)   Wt 161 lb (73 kg)   SpO2 98%   BMI 28.52 kg/m  General: Well developed, NAD, BMI noted Neck: No  thyromegaly  HEENT:  Normocephalic . Face symmetric, atraumatic Lungs:  CTA B Normal respiratory effort, no intercostal retractions, no accessory muscle use. Heart: RRR,  no murmur.  Abdomen:  Not distended, soft, non-tender. No rebound or rigidity.   Lower extremities: no pretibial edema bilaterally  Skin: Exposed areas without rash. Not pale. Not jaundice Neurologic:  alert & oriented X3.  Speech normal, gait appropriate for age and unassisted Strength symmetric and appropriate for age.  Psych: Cognition and judgment appear intact.  Cooperative with normal attention span and concentration.  Behavior appropriate. No anxious or depressed appearing.     Assessment     Assessment HTN Ovarian cyst Kidney stone 01/2014 Birth control: Husband had a vasectomy  CP: Stress test normal 05/2017 Shingles: L leg 10/2019  Thyroid nodule.  [place] 2022.  PLAN Here for CPX -Td 2016 - covid vax x2, booster is an option   - flu shot at work -[employer]  care per gyn -Encouraged to increase physical activity, doing okay with diet. -Labs: CMP FLP CBC A1c TSH  HTN: On metoprolol, ambulatory BPs in the 130/80.  Very rarely has a diastolic BP of 90.  No change.  Labs. Thyroid nodule: On clinical exam I was unable to find a nodule.  Ultrasound was 2022, offered a follow-up but we agreed to simply reassess next year.  Checking labs. RTC 1 year

## 2022-07-16 LAB — LIPID PANEL
Cholesterol: 201 mg/dL — ABNORMAL HIGH (ref <200)
HDL: 61 mg/dL (ref >=50)
LDL Cholesterol (Calc): 115 mg/dL (calc) — ABNORMAL HIGH
Non-HDL Cholesterol (Calc): 140 mg/dL (calc) — ABNORMAL HIGH (ref <130)
Total CHOL/HDL Ratio: 3.3 (calc) (ref <5.0)
Triglycerides: 135 mg/dL (ref <150)

## 2022-07-16 LAB — COMPREHENSIVE METABOLIC PANEL
AG Ratio: 2.1 (calc) (ref 1.0–2.5)
ALT: 33 U/L — ABNORMAL HIGH (ref 6–29)
AST: 26 U/L (ref 10–30)
Albumin: 4.7 g/dL (ref 3.6–5.1)
Alkaline phosphatase (APISO): 69 U/L (ref 31–125)
BUN: 9 mg/dL (ref 7–25)
CO2: 27 mmol/L (ref 20–32)
Calcium: 9.5 mg/dL (ref 8.6–10.2)
Chloride: 104 mmol/L (ref 98–110)
Creat: 0.79 mg/dL (ref 0.50–0.97)
Globulin: 2.2 g/dL (calc) (ref 1.9–3.7)
Glucose, Bld: 82 mg/dL (ref 65–99)
Potassium: 3.6 mmol/L (ref 3.5–5.3)
Sodium: 140 mmol/L (ref 135–146)
Total Bilirubin: 1 mg/dL (ref 0.2–1.2)
Total Protein: 6.9 g/dL (ref 6.1–8.1)

## 2022-07-16 LAB — CBC WITH DIFFERENTIAL/PLATELET
Absolute Monocytes: 299 cells/uL (ref 200–950)
Basophils Absolute: 72 cells/uL (ref 0–200)
Basophils Relative: 1.1 %
Eosinophils Relative: 7.1 %
HCT: 39.9 % (ref 35.0–45.0)
Hemoglobin: 13.9 g/dL (ref 11.7–15.5)
Lymphs Abs: 2002 cells/uL (ref 850–3900)
MCH: 31.6 pg (ref 27.0–33.0)
MPV: 8.8 fL (ref 7.5–12.5)
Neutro Abs: 3666 cells/uL (ref 1500–7800)
Neutrophils Relative %: 56.4 %
Platelets: 270 10*3/uL (ref 140–400)
RBC: 4.4 10*6/uL (ref 3.80–5.10)
RDW: 11.7 % (ref 11.0–15.0)
Total Lymphocyte: 30.8 %

## 2022-07-16 LAB — HEMOGLOBIN A1C
Hgb A1c MFr Bld: 4.9 % of total Hgb (ref <5.7)
Mean Plasma Glucose: 94 mg/dL
eAG (mmol/L): 5.2 mmol/L

## 2022-07-16 LAB — TSH: TSH: 1.27 mIU/L

## 2022-07-22 ENCOUNTER — Other Ambulatory Visit (HOSPITAL_COMMUNITY): Payer: Self-pay

## 2022-11-01 ENCOUNTER — Other Ambulatory Visit (HOSPITAL_COMMUNITY): Payer: Self-pay

## 2022-11-01 ENCOUNTER — Other Ambulatory Visit: Payer: Self-pay | Admitting: Internal Medicine

## 2022-11-01 MED ORDER — METOPROLOL SUCCINATE ER 100 MG PO TB24
100.0000 mg | ORAL_TABLET | Freq: Every day | ORAL | 2 refills | Status: DC
  Filled 2022-11-01: qty 90, 90d supply, fill #0
  Filled 2023-01-26: qty 90, 90d supply, fill #1
  Filled 2023-05-01: qty 90, 90d supply, fill #2

## 2022-12-29 ENCOUNTER — Encounter: Payer: Self-pay | Admitting: Internal Medicine

## 2023-05-01 ENCOUNTER — Other Ambulatory Visit (HOSPITAL_COMMUNITY): Payer: Self-pay

## 2023-07-18 ENCOUNTER — Encounter: Admitting: Internal Medicine

## 2023-07-31 ENCOUNTER — Other Ambulatory Visit: Payer: Self-pay

## 2023-07-31 ENCOUNTER — Other Ambulatory Visit: Payer: Self-pay | Admitting: Internal Medicine

## 2023-07-31 ENCOUNTER — Other Ambulatory Visit (HOSPITAL_COMMUNITY): Payer: Self-pay

## 2023-07-31 MED ORDER — METOPROLOL SUCCINATE ER 100 MG PO TB24
100.0000 mg | ORAL_TABLET | Freq: Every day | ORAL | 0 refills | Status: DC
  Filled 2023-07-31: qty 90, 90d supply, fill #0

## 2023-08-24 DIAGNOSIS — Z1389 Encounter for screening for other disorder: Secondary | ICD-10-CM | POA: Diagnosis not present

## 2023-08-24 DIAGNOSIS — Z01419 Encounter for gynecological examination (general) (routine) without abnormal findings: Secondary | ICD-10-CM | POA: Diagnosis not present

## 2023-08-24 DIAGNOSIS — Z130 Encounter for screening for diseases of the blood and blood-forming organs and certain disorders involving the immune mechanism: Secondary | ICD-10-CM | POA: Diagnosis not present

## 2023-08-29 ENCOUNTER — Ambulatory Visit (INDEPENDENT_AMBULATORY_CARE_PROVIDER_SITE_OTHER): Admitting: Internal Medicine

## 2023-08-29 ENCOUNTER — Encounter: Payer: Self-pay | Admitting: Internal Medicine

## 2023-08-29 VITALS — BP 108/84 | HR 74 | Temp 98.3°F | Resp 16 | Ht 63.0 in | Wt 142.2 lb

## 2023-08-29 DIAGNOSIS — Z0000 Encounter for general adult medical examination without abnormal findings: Secondary | ICD-10-CM

## 2023-08-29 DIAGNOSIS — I10 Essential (primary) hypertension: Secondary | ICD-10-CM | POA: Diagnosis not present

## 2023-08-29 DIAGNOSIS — Z23 Encounter for immunization: Secondary | ICD-10-CM

## 2023-08-29 LAB — CBC WITH DIFFERENTIAL/PLATELET
Basophils Absolute: 0 K/uL (ref 0.0–0.1)
Basophils Relative: 0.6 % (ref 0.0–3.0)
Eosinophils Absolute: 0.4 K/uL (ref 0.0–0.7)
Eosinophils Relative: 5.3 % — ABNORMAL HIGH (ref 0.0–5.0)
HCT: 41.2 % (ref 36.0–46.0)
Hemoglobin: 14.5 g/dL (ref 12.0–15.0)
Lymphocytes Relative: 27.3 % (ref 12.0–46.0)
Lymphs Abs: 1.8 K/uL (ref 0.7–4.0)
MCHC: 35.1 g/dL (ref 30.0–36.0)
MCV: 91.8 fl (ref 78.0–100.0)
Monocytes Absolute: 0.4 K/uL (ref 0.1–1.0)
Monocytes Relative: 5.6 % (ref 3.0–12.0)
Neutro Abs: 4.1 K/uL (ref 1.4–7.7)
Neutrophils Relative %: 61.2 % (ref 43.0–77.0)
Platelets: 285 K/uL (ref 150.0–400.0)
RBC: 4.48 Mil/uL (ref 3.87–5.11)
RDW: 12.7 % (ref 11.5–15.5)
WBC: 6.7 K/uL (ref 4.0–10.5)

## 2023-08-29 LAB — COMPREHENSIVE METABOLIC PANEL WITH GFR
ALT: 21 U/L (ref 0–35)
AST: 18 U/L (ref 0–37)
Albumin: 5.1 g/dL (ref 3.5–5.2)
Alkaline Phosphatase: 57 U/L (ref 39–117)
BUN: 10 mg/dL (ref 6–23)
CO2: 29 meq/L (ref 19–32)
Calcium: 9.9 mg/dL (ref 8.4–10.5)
Chloride: 101 meq/L (ref 96–112)
Creatinine, Ser: 0.67 mg/dL (ref 0.40–1.20)
GFR: 109.75 mL/min (ref >60.00)
Glucose, Bld: 76 mg/dL (ref 70–99)
Potassium: 3.6 meq/L (ref 3.5–5.1)
Sodium: 137 meq/L (ref 135–145)
Total Bilirubin: 0.7 mg/dL (ref 0.2–1.2)
Total Protein: 7.2 g/dL (ref 6.0–8.3)

## 2023-08-29 LAB — LIPID PANEL
Cholesterol: 199 mg/dL (ref 0–200)
HDL: 59.4 mg/dL (ref >39.00)
LDL Cholesterol: 121 mg/dL — ABNORMAL HIGH (ref 0–99)
NonHDL: 139.64
Total CHOL/HDL Ratio: 3
Triglycerides: 91 mg/dL (ref 0.0–149.0)
VLDL: 18.2 mg/dL (ref 0.0–40.0)

## 2023-08-29 NOTE — Progress Notes (Unsigned)
   Subjective:    Patient ID: Tanya Hicks, female    DOB: 03/17/1985, 37 y.o.   MRN: [id number]  DOS:  08/29/2023 Type of visit - description: CPX  Here for CPX  Review of Systems See above   Past Medical History:  Diagnosis Date   Hypertension    Kidney stone 01-2014   Ovarian cyst    Pregnancy induced hypertension    PROM (premature rupture of membranes) 08/13/2014   SVD (spontaneous vaginal delivery) 08/14/2014    Past Surgical History:  Procedure Laterality Date   WISDOM TOOTH EXTRACTION      Current Outpatient Medications  Medication Instructions   augmented betamethasone  dipropionate (DIPROLENE -AF) 0.05 % ointment Apply as directed to affected area twice a day   metoprolol  succinate (TOPROL -XL) 100 mg, Oral, Daily, Take with or immediately following a meal   metroNIDAZOLE  (METROCREAM ) 0.75 % cream Apply 1 application (a small amount) topically to affected area 2 (two) times daily.   triamcinolone  ointment (KENALOG ) 0.5 % Apply 1 application topically 2 (two) times daily.       Objective:   Physical Exam BP 108/84   Pulse 74   Temp 98.3 F (36.8 C) (Oral)   Resp 16   Ht 5' 3 (1.6 m)   Wt 142 lb 4 oz (64.5 kg)   LMP 08/08/2023 (Exact Date)   SpO2 99%   BMI 25.20 kg/m  General: Well developed, NAD, BMI noted Neck: No  thyromegaly  HEENT:  Normocephalic . Face symmetric, atraumatic Lungs:  CTA B Normal respiratory effort, no intercostal retractions, no accessory muscle use. Heart: RRR,  no murmur.  Abdomen:  Not distended, soft, non-tender. No rebound or rigidity.   Lower extremities: no pretibial edema bilaterally  Skin: Exposed areas without rash. Not pale. Not jaundice Neurologic:  alert & oriented X3.  Speech normal, gait appropriate for age and unassisted Strength symmetric and appropriate for age.  Psych: Cognition and judgment appear intact.  Cooperative with normal attention span and concentration.  Behavior appropriate. No anxious or  depressed appearing.     Assessment    Assessment HTN Ovarian cyst Kidney stone 01/2014 Birth control: Husband had a vasectomy  CP: Stress test normal 05/2017 Shingles: L leg 10/2019  Thyroid  nodule.  US  2022.  No follow-up  PLAN Here for CPX -Td 2016 - covid vax recommended  - flu shot at work -Gyn care per gyn.  She already discussed.  Mammogram next year. - Doing well with lifestyle. -Labs: Hep B serology Environmental health practitioner) CMP FLP CBC  Other issues: HTN: On metoprolol , when BP is checked is typically okay, very seldom is in the 140s. Thyroid  nodule: Physical exam negative, observation. RTC 1 year   ==== HTN: On metoprolol , ambulatory BPs in the 130/80.  Very rarely has a diastolic BP of 90.  No change.  Labs. Thyroid  nodule: On clinical exam I was unable to find a nodule.  Ultrasound was 2022, offered a follow-up but we agreed to simply reassess next year.  Checking labs. RTC 1 year

## 2023-08-29 NOTE — Patient Instructions (Signed)
 Vaccines are recommended Flu shot every fall Also consider a COVID booster  Check the  blood pressure regularly Blood pressure goal:  between 110/65 and  135/85. If it is consistently higher or lower, let me know     GO TO THE LAB :  Get the blood work   Your results will be posted on MyChart with my comments  Next office visit for a physical exam in 1 year Please make an appointment before you leave today

## 2023-08-30 ENCOUNTER — Encounter: Payer: Self-pay | Admitting: Internal Medicine

## 2023-08-30 LAB — HEPATITIS B SURFACE ANTIGEN: Hepatitis B Surface Ag: NONREACTIVE

## 2023-08-30 LAB — HEPATITIS B SURFACE ANTIBODY,QUALITATIVE: Hep B S Ab: REACTIVE — AB

## 2023-08-30 NOTE — Assessment & Plan Note (Signed)
 Here for CPX   Other issues: HTN: On metoprolol , when BP is checked is typically okay, very seldom is in the 140s. Thyroid  nodule: Physical exam negative, observation. RTC 1 year

## 2023-08-30 NOTE — Assessment & Plan Note (Signed)
 Here for CPX -Td 2016 - covid vax recommended  - flu shot at work -Gyn care per gyn.  She already discussed the need for a MMG next year. - Doing well with lifestyle. -Labs: Hep B serology Environmental health practitioner) CMP FLP CBC

## 2023-09-01 ENCOUNTER — Ambulatory Visit: Payer: Self-pay | Admitting: Internal Medicine

## 2023-10-24 ENCOUNTER — Other Ambulatory Visit: Payer: Self-pay | Admitting: Internal Medicine

## 2023-10-24 ENCOUNTER — Other Ambulatory Visit (HOSPITAL_COMMUNITY): Payer: Self-pay

## 2023-10-24 MED ORDER — METOPROLOL SUCCINATE ER 100 MG PO TB24
100.0000 mg | ORAL_TABLET | Freq: Every day | ORAL | 3 refills | Status: AC
  Filled 2023-10-24: qty 90, 90d supply, fill #0
  Filled 2024-01-23: qty 90, 90d supply, fill #1

## 2023-10-25 ENCOUNTER — Other Ambulatory Visit (HOSPITAL_COMMUNITY): Payer: Self-pay
